# Patient Record
Sex: Female | Born: 1980 | Race: Black or African American | Hispanic: No | Marital: Married | Smoking: Never smoker
Health system: Southern US, Community
[De-identification: ages and names within clinical notes are randomized; demographics above are authoritative.]

## PROBLEM LIST (undated history)

## (undated) DIAGNOSIS — B019 Varicella without complication: Secondary | ICD-10-CM

## (undated) DIAGNOSIS — D649 Anemia, unspecified: Secondary | ICD-10-CM

## (undated) DIAGNOSIS — O99019 Anemia complicating pregnancy, unspecified trimester: Secondary | ICD-10-CM

## (undated) DIAGNOSIS — N736 Female pelvic peritoneal adhesions (postinfective): Secondary | ICD-10-CM

## (undated) DIAGNOSIS — T7840XA Allergy, unspecified, initial encounter: Secondary | ICD-10-CM

## (undated) HISTORY — DX: Anemia, unspecified: D64.9

## (undated) HISTORY — DX: Female pelvic peritoneal adhesions (postinfective): N73.6

## (undated) HISTORY — PX: DENTAL SURGERY: SHX609

## (undated) HISTORY — DX: Anemia complicating pregnancy, unspecified trimester: O99.019

## (undated) HISTORY — DX: Varicella without complication: B01.9

## (undated) HISTORY — DX: Allergy, unspecified, initial encounter: T78.40XA

---

## 2000-11-18 ENCOUNTER — Emergency Department (HOSPITAL_COMMUNITY): Admission: EM | Admit: 2000-11-18 | Discharge: 2000-11-18 | Payer: Self-pay | Admitting: Emergency Medicine

## 2000-11-26 ENCOUNTER — Emergency Department (HOSPITAL_COMMUNITY): Admission: EM | Admit: 2000-11-26 | Discharge: 2000-11-26 | Payer: Self-pay | Admitting: Emergency Medicine

## 2004-08-16 ENCOUNTER — Other Ambulatory Visit: Admission: RE | Admit: 2004-08-16 | Discharge: 2004-08-16 | Payer: Self-pay | Admitting: Obstetrics and Gynecology

## 2005-03-10 ENCOUNTER — Inpatient Hospital Stay (HOSPITAL_COMMUNITY): Admission: AD | Admit: 2005-03-10 | Discharge: 2005-03-14 | Payer: Self-pay | Admitting: *Deleted

## 2005-03-11 ENCOUNTER — Encounter (INDEPENDENT_AMBULATORY_CARE_PROVIDER_SITE_OTHER): Payer: Self-pay | Admitting: Specialist

## 2005-08-27 ENCOUNTER — Other Ambulatory Visit: Admission: RE | Admit: 2005-08-27 | Discharge: 2005-08-27 | Payer: Self-pay | Admitting: Obstetrics and Gynecology

## 2006-10-16 ENCOUNTER — Other Ambulatory Visit: Admission: RE | Admit: 2006-10-16 | Discharge: 2006-10-16 | Payer: Self-pay | Admitting: Obstetrics and Gynecology

## 2007-02-23 ENCOUNTER — Emergency Department (HOSPITAL_COMMUNITY): Admission: EM | Admit: 2007-02-23 | Discharge: 2007-02-23 | Payer: Self-pay | Admitting: Family Medicine

## 2008-03-04 ENCOUNTER — Emergency Department (HOSPITAL_COMMUNITY): Admission: EM | Admit: 2008-03-04 | Discharge: 2008-03-04 | Payer: Self-pay | Admitting: Family Medicine

## 2011-04-28 ENCOUNTER — Emergency Department (HOSPITAL_COMMUNITY): Payer: BC Managed Care – PPO

## 2011-04-28 ENCOUNTER — Emergency Department (HOSPITAL_COMMUNITY)
Admission: EM | Admit: 2011-04-28 | Discharge: 2011-04-29 | Disposition: A | Discharge status: Home / Self Care | Payer: BC Managed Care – PPO | Attending: Emergency Medicine | Admitting: Emergency Medicine

## 2011-04-28 DIAGNOSIS — S61209A Unspecified open wound of unspecified finger without damage to nail, initial encounter: Secondary | ICD-10-CM | POA: Insufficient documentation

## 2011-04-28 DIAGNOSIS — Y92009 Unspecified place in unspecified non-institutional (private) residence as the place of occurrence of the external cause: Secondary | ICD-10-CM | POA: Insufficient documentation

## 2011-04-28 DIAGNOSIS — W268XXA Contact with other sharp object(s), not elsewhere classified, initial encounter: Secondary | ICD-10-CM | POA: Insufficient documentation

## 2011-04-28 DIAGNOSIS — M79609 Pain in unspecified limb: Secondary | ICD-10-CM | POA: Insufficient documentation

## 2011-05-18 NOTE — Discharge Summary (Signed)
[name], [name]               ACCOUNT NO.:  [id number]   MEDICAL RECORD NO.:  [id number]          PATIENT TYPE:  INP   LOCATION:  9142                          FACILITY:  [facility]   PHYSICIAN:  Naima A. Dillard, M.D. DATE OF BIRTH:  [birth year]   DATE OF ADMISSION:  03/10/2005  DATE OF DISCHARGE:  03/14/2005                                 DISCHARGE SUMMARY   ADMITTING DIAGNOSIS:  Intrauterine pregnancy at term, early active labor.   PREOPERATIVE DIAGNOSIS:  Intrauterine pregnancy at term, failure to  progress, chorioamnionitis.   PROCEDURE:  Primary low transverse cesarean section.   DISCHARGE DIAGNOSES:  1.  Intrauterine pregnancy at term, early active labor.  2.  Intrauterine pregnancy at term, failure to progress, chorioamnionitis.    [name] is a 30 year old gravida 1 para 0 who presented at term in early  labor.  She progressed to 8 cm, and at that time despite Pitocin  augmentation and an appropriate amount of time the cervix was unchanged,  with the fetal head having significant molding and caput.  The decision was  made at that point to proceed with primary low transverse cesarean section  following explanation of risks and benefits by Dr. Dois Davenport Rivard.  Surgery  was performed by Dr. Silverio Lay, with Nigel Bridgeman, C.N.M. as first  assistant.  The patient delivered an 8 pound 6 ounce female infant named  Letiell, with Apgar scores of 8 at one minute, 9 at five minutes.  The  patient has progressed well in the postoperative period.  Her hemoglobin on  the first postoperative day was 8.8.  Her vital signs have remained stable,  and she has been afebrile.  Her incision is clean and intact.  It is healing  well.  She is both breast and bottle feeding, and today, her third  postoperative day, both she and her infant are judged to be in satisfactory  condition for discharge.   DISCHARGE INSTRUCTIONS:  Per [facility] handout.   DISCHARGE MEDICATIONS:  1.   Motrin 600 mg p.o. q.6 h. p.r.n. pain.  2.  Tylox 1-2 p.o. q.3-4 h. p.r.n. pain.  3.  Prenatal vitamins plus iron supplement b.i.d.   DISCHARGE FOLLOWUP:  Will be at [facility] in six weeks.      SDM/MEDQ  D:  03/14/2005  T:  03/14/2005  Job:  [id number]
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
## 2011-05-18 NOTE — Op Note (Signed)
NAMEKYNZLEY, DOWSON               ACCOUNT NO.:  [id number]   MEDICAL RECORD NO.:  [id number]          PATIENT TYPE:  INP   LOCATION:  9173                          [facility]ACILITY:  [facility]   PHYSICIAN:  Crist Fat. Rivard, M.D. DATE OF BIRTH:  [birth year]   DATE OF PROCEDURE:  03/11/2005  DATE OF DISCHARGE:                                 OPERATIVE REPORT   PREOPERATIVE DIAGNOSIS:  Intrauterine pregnancy at 39 weeks 3 days, failure  to progress, chorioamnionitis.   POSTOPERATIVE DIAGNOSIS:  Intrauterine pregnancy at 39 weeks 3 days, failure  to progress, chorioamnionitis.   PROCEDURE:  Primary low transverse cesarean section.   SURGEON:  Crist Fat. Rivard, M.D.   ASSISTANTRenaldo Reel. Emilee Hero, C.N.M.   ANESTHESIA:  Epidural by Quillian Quince, M.D.   ESTIMATED BLOOD LOSS:  700 mL.   DESCRIPTION OF PROCEDURE:  After being informed of the planned procedure  with possible complications including bleeding, infection, injury to bowel,  bladder, and ureters, informed consent is obtained.  The patient is taken to  OR#1 and pre-existing epidural is reinforced.  She is prepped and draped in  the usual sterile fashion, placed in the dorsal decubitus position, pelvis  tilted to the left, and a Foley catheter is already in her bladder.   After ascertaining adequate level of anesthesia, the suprapubic area is  infiltrated with 20 mL of Marcaine 0.25% and we performed a Pfannenstiel  incision which is brought down to the fascia sharply.  The fascia is incised  in a low transverse fashion.  Linea alba is dissected.  Peritoneum is  entered in a midline fashion.  The visceroperitoneum is entered in a low  transverse fashion allowing [place] to safely retract bladder by developing a  bladder flap.  Myometrium is then entered in a low transverse fashion first  sharply, then extended bluntly.  Amniotic fluid is clear.  We assisted the  birth of a female infant in LOA at 11:19 a.m.  Mouth and nose are suctioned.  Body of the baby is delivered.  Cord is clamped with two Kelly clamps and  sectioned and the baby is given to the pediatrician present in the room.   20 mL of blood is drawn from the umbilical vein and the placenta is allowed  to deliver spontaneously.  It is complete and cord has three vessels and  placenta is sent to pathology due to the history of chorioamnionitis.  Uterine revision is negative.   We then proceeded with closure of the myometrium in two layers first with a  running locked suture of 0 Vicryl and then with a Lembert suture of 0 Vicryl  imbricating the first one.  Hemostasis is assessed and adequate.  Both  pericolic gutters are cleansed.  Both tubes and ovaries assessed and normal.  The pelvis is then profusely irrigated with warm saline.  Hemostasis  reassessed and adequate.   On the fascia, hemostasis is completed with cautery and fascia is closed  with two running sutures of 0 Vicryl meeting midline.  Wound is irrigated  with warm saline.  Hemostasis is completed  with cautery and skin is closed  with staples.   Needle, sponge, and instrument counts correct x2.  Estimated blood loss is  700 mL.  The procedure is well tolerated by the patient who is taken to the  recovery room in well and stable condition.   A little boy named Lattrell was born at 11:19 a.m., received Apgars of 8 at  one minute and 9 at five minutes, and weighed 8 pounds 6 ounces.      SAR/MEDQ  D:  03/11/2005  T:  03/12/2005  Job:  [id number]

## 2011-12-26 ENCOUNTER — Encounter (HOSPITAL_COMMUNITY): Payer: Self-pay | Admitting: *Deleted

## 2011-12-26 ENCOUNTER — Inpatient Hospital Stay (HOSPITAL_COMMUNITY)
Admission: AD | Admit: 2011-12-26 | Discharge: 2011-12-26 | Disposition: A | Discharge status: Home / Self Care | Payer: BC Managed Care – HMO | Source: Ambulatory Visit | Attending: Obstetrics and Gynecology | Admitting: Obstetrics and Gynecology

## 2011-12-26 DIAGNOSIS — O99891 Other specified diseases and conditions complicating pregnancy: Secondary | ICD-10-CM | POA: Insufficient documentation

## 2011-12-26 LAB — WET PREP, GENITAL
Clue Cells Wet Prep HPF POC: NONE SEEN
Trich, Wet Prep: NONE SEEN

## 2011-12-26 LAB — URINALYSIS, ROUTINE W REFLEX MICROSCOPIC
Leukocytes, UA: NEGATIVE
Nitrite: NEGATIVE
Specific Gravity, Urine: 1.025 (ref 1.005–1.030)
pH: 6 (ref 5.0–8.0)

## 2011-12-26 NOTE — Progress Notes (Signed)
Reports "i keep wetting myself and i just want to make sure it isn't fluid. It is thinner like fluid from the baby." this has been happening off/on for a week. States it seems to be getting worse. G2 P1.

## 2011-12-26 NOTE — Progress Notes (Signed)
Notified of pt presenting for ? Leaking.  Will be down to see pt.

## 2011-12-26 NOTE — Discharge Instructions (Signed)
You have leukorrhea, which is white blood cells near the cervix. This is normal and you may notice increased mucous or watery discharge.  You need to be on pelvic rest until further notice. No sexual intercourse. If you have increased discharge, that is a large amount of water or has an odor or itching, or heavy vaginal bleeding, please call the office.  If you have increased pelvic cramping, please call the office. Please schedule an appointment in 1 week.

## 2011-12-26 NOTE — ED Provider Notes (Signed)
History     Chief Complaint  Patient presents with  . Rupture of Membranes   HPI Comments: Pt is a G3P1 at [redacted]w[redacted]d with CC of leaking fluid. Pt called the office today and was told to come in. She states this has been going on x1 week, feels like she also has urinary urgency. Denies odor to the d/c, no itching or burning. She denies any VB or ctx/cramping. Has not really had FM yet.  She was evaluated in the 1st trimester for VB and noted to have a [facility] that did resolve.       Past Medical History  Diagnosis Date  . No pertinent past medical history     Past Surgical History  Procedure Date  . Cesarean section   . Dental surgery     History reviewed. No pertinent family history.  History  Substance Use Topics  . Smoking status: Never Smoker   . Smokeless tobacco: Not on file  . Alcohol Use: No    Allergies: No Known Allergies  Prescriptions prior to admission  Medication Sig Dispense Refill  . Prenatal Vit-Fe Fumarate-FA (PRENATAL MULTIVITAMIN) TABS Take 1 tablet by mouth daily.          Review of Systems  Genitourinary: Positive for urgency.       Vaginal d/c leaking?  All other systems reviewed and are negative.   Physical Exam   Blood pressure 133/83, pulse 86, temperature 99 F (37.2 C), resp. rate 16, height 5\' 3"  (1.6 m), weight 65.318 kg (144 lb), SpO2 99.00%.  Physical Exam  Nursing note and vitals reviewed. Constitutional: She is oriented to person, place, and time. She appears well-developed and well-nourished.  Neck: Normal range of motion.  Cardiovascular: Normal rate.   Respiratory: Effort normal.  GI: Soft. She exhibits no distension. There is no tenderness.  Genitourinary: Vaginal discharge found.       Leukorrhea cx - external os FT, int os closed, long, high soft  Musculoskeletal: Normal range of motion. She exhibits no edema.  Neurological: She is alert and oriented to person, place, and time. She has normal reflexes.  Skin: Skin is warm  and dry.  Psychiatric: She has a normal mood and affect. Her behavior is normal.   FHT's 160's Results for orders placed during the hospital encounter of 12/26/11 (from the past 24 hour(s))  URINALYSIS, ROUTINE W REFLEX MICROSCOPIC     Status: Normal   Collection Time   12/26/11  8:01 PM      Component Value Range   Color, Urine YELLOW  YELLOW    APPearance CLEAR  CLEAR    Specific Gravity, Urine 1.025  1.005 - 1.030    pH 6.0  5.0 - 8.0    Glucose, UA NEGATIVE  NEGATIVE (mg/dL)   Hgb urine dipstick NEGATIVE  NEGATIVE    Bilirubin Urine NEGATIVE  NEGATIVE    Ketones, ur NEGATIVE  NEGATIVE (mg/dL)   Protein, ur NEGATIVE  NEGATIVE (mg/dL)   Urobilinogen, UA 0.2  0.0 - 1.0 (mg/dL)   Nitrite NEGATIVE  NEGATIVE    Leukocytes, UA NEGATIVE  NEGATIVE   WET PREP, GENITAL     Status: Abnormal   Collection Time   12/26/11  8:12 PM      Component Value Range   Yeast, Wet Prep NONE SEEN  NONE SEEN    Trich, Wet Prep NONE SEEN  NONE SEEN    Clue Cells, Wet Prep NONE SEEN  NONE SEEN  WBC, Wet Prep HPF POC FEW (*) NONE SEEN      MAU Course  Procedures  Wet prep [place] PE  Assessment and Plan  IUP 15w UA and wet prep normal except for [facility]  External os FT Pt to f/u office 1 week   Alano Blasco M 12/26/2011, 8:54 PM

## 2011-12-26 NOTE — Progress Notes (Signed)
S. Lillard, CNM at bedside.  Assessment done and poc discussed with pt.  

## 2011-12-26 NOTE — Progress Notes (Signed)
SSE per CNM.  Wet prep and cultures collected.  VE done.  

## 2012-03-21 ENCOUNTER — Other Ambulatory Visit (INDEPENDENT_AMBULATORY_CARE_PROVIDER_SITE_OTHER): Payer: BC Managed Care – PPO

## 2012-03-21 ENCOUNTER — Encounter (INDEPENDENT_AMBULATORY_CARE_PROVIDER_SITE_OTHER): Payer: BC Managed Care – PPO

## 2012-03-21 DIAGNOSIS — Z331 Pregnant state, incidental: Secondary | ICD-10-CM

## 2012-04-01 ENCOUNTER — Encounter (INDEPENDENT_AMBULATORY_CARE_PROVIDER_SITE_OTHER): Payer: BC Managed Care – PPO | Admitting: Registered Nurse

## 2012-04-01 DIAGNOSIS — Z331 Pregnant state, incidental: Secondary | ICD-10-CM

## 2012-04-16 ENCOUNTER — Encounter: Payer: Self-pay | Admitting: Obstetrics and Gynecology

## 2012-04-16 ENCOUNTER — Ambulatory Visit (INDEPENDENT_AMBULATORY_CARE_PROVIDER_SITE_OTHER): Payer: BC Managed Care – PPO | Admitting: Obstetrics and Gynecology

## 2012-04-16 VITALS — BP 100/70 | Wt 166.0 lb

## 2012-04-16 DIAGNOSIS — Z3480 Encounter for supervision of other normal pregnancy, unspecified trimester: Secondary | ICD-10-CM

## 2012-04-16 DIAGNOSIS — Z98891 History of uterine scar from previous surgery: Secondary | ICD-10-CM

## 2012-04-16 MED ORDER — MICONAZOLE NITRATE 2 % EX CREA: TOPICAL_CREAM | Freq: Two times a day (BID) | CUTANEOUS | Status: DC

## 2012-04-16 NOTE — Progress Notes (Signed)
Doing well,  Rv'd FKC and PTL sx's Has rash on L forearm, small about 1cm, ? 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

## 2012-05-01 ENCOUNTER — Ambulatory Visit (INDEPENDENT_AMBULATORY_CARE_PROVIDER_SITE_OTHER): Payer: BC Managed Care – PPO | Admitting: Obstetrics and Gynecology

## 2012-05-01 ENCOUNTER — Encounter: Payer: Self-pay | Admitting: Obstetrics and Gynecology

## 2012-05-01 VITALS — BP 110/62 | Wt 166.0 lb

## 2012-05-01 DIAGNOSIS — D649 Anemia, unspecified: Secondary | ICD-10-CM | POA: Insufficient documentation

## 2012-05-01 DIAGNOSIS — Z98891 History of uterine scar from previous surgery: Secondary | ICD-10-CM

## 2012-05-01 DIAGNOSIS — Z9889 Other specified postprocedural states: Secondary | ICD-10-CM

## 2012-05-01 DIAGNOSIS — Z331 Pregnant state, incidental: Secondary | ICD-10-CM | POA: Insufficient documentation

## 2012-05-01 NOTE — Progress Notes (Signed)
Patient ID: [name], female   DOB: [birth year], 31 y.o.   MRN: [id number] C/o pain with very active baby last night low in front no pain today Vag LTC Reviewed s/s preterm labor, srom, vag bleeding, kick counts to report, enc 8 water daily and frequent voids H/H today Lavera Guise,  CNM

## 2012-05-01 NOTE — Progress Notes (Signed)
Pt states that baby was very active last night also states that she was feeling a pinching pelvic area, pt request vaginal exam.

## 2012-05-02 LAB — HEMOGLOBIN AND HEMATOCRIT, BLOOD: Hemoglobin: 9.5 g/dL — ABNORMAL LOW (ref 12.0–15.0)

## 2012-05-08 ENCOUNTER — Telehealth: Payer: Self-pay

## 2012-05-08 NOTE — Telephone Encounter (Signed)
Message copied by Janeece Agee on Thu May 08, 2012  3:48 PM ------      Message from: [facility]      Created: Tue May 06, 2012  9:31 AM      Regarding: anemia call taking pnv and iron bid recommended, colace 100 mg bid or miralax 1-5 daily OTC store brand                   ----- Message -----         From: [facility]         Sent: 05/02/2012  12:21 AM           To: Lavera Guise, CNM

## 2012-05-09 ENCOUNTER — Telehealth: Payer: Self-pay | Admitting: Obstetrics and Gynecology

## 2012-05-09 NOTE — Telephone Encounter (Signed)
Spoke with pt informing her HCT/Hemoglobin abnormal & Corrie Dandy recommends taking pnv and iron bid, & colace 100 mg bid or miralax 1-5 daily OTC store brand. Pt agrees and voices understanding.

## 2012-05-12 ENCOUNTER — Ambulatory Visit (INDEPENDENT_AMBULATORY_CARE_PROVIDER_SITE_OTHER): Payer: BC Managed Care – PPO | Admitting: Obstetrics and Gynecology

## 2012-05-12 ENCOUNTER — Encounter: Payer: Self-pay | Admitting: Obstetrics and Gynecology

## 2012-05-12 VITALS — BP 114/62 | Wt 165.0 lb

## 2012-05-12 DIAGNOSIS — Z331 Pregnant state, incidental: Secondary | ICD-10-CM

## 2012-05-12 NOTE — Progress Notes (Signed)
Pt states discharge has gotten thicker

## 2012-05-12 NOTE — Progress Notes (Signed)
Patient ID: [name], female   DOB: 1981-07-22, 31 y.o.   MRN: [id number] Reviewed s/s preterm labor, srom, vag bleeding, kick counts to report, enc 8 water daily and frequent voids Lavera Guise, CNM

## 2012-05-20 ENCOUNTER — Ambulatory Visit (INDEPENDENT_AMBULATORY_CARE_PROVIDER_SITE_OTHER): Payer: BC Managed Care – PPO | Admitting: Obstetrics and Gynecology

## 2012-05-20 VITALS — BP 110/62 | Wt 170.0 lb

## 2012-05-20 DIAGNOSIS — Z331 Pregnant state, incidental: Secondary | ICD-10-CM

## 2012-05-20 NOTE — Progress Notes (Signed)
GBS today. Pt states she has no concerns today.  Pt states she is taking iron "Floradix ". Pt asked will she be getting any more blood work done to check her iron levels.

## 2012-05-20 NOTE — Progress Notes (Signed)
Beta strep sent.  Return to office in one week.  VBAC discussed.

## 2012-05-29 ENCOUNTER — Ambulatory Visit (INDEPENDENT_AMBULATORY_CARE_PROVIDER_SITE_OTHER): Payer: BC Managed Care – PPO | Admitting: Obstetrics and Gynecology

## 2012-05-29 ENCOUNTER — Encounter: Payer: Self-pay | Admitting: Obstetrics and Gynecology

## 2012-05-29 VITALS — BP 100/56 | Wt 172.0 lb

## 2012-05-29 DIAGNOSIS — Z98891 History of uterine scar from previous surgery: Secondary | ICD-10-CM

## 2012-05-29 DIAGNOSIS — Z9889 Other specified postprocedural states: Secondary | ICD-10-CM

## 2012-05-29 NOTE — Progress Notes (Signed)
A/P GBS negative Fetal kick counts reviewed Labor reviewed with pt All patients  questions answered 

## 2012-05-29 NOTE — Progress Notes (Signed)
Pt states she has no concerns today.  

## 2012-06-06 ENCOUNTER — Ambulatory Visit (INDEPENDENT_AMBULATORY_CARE_PROVIDER_SITE_OTHER): Payer: BC Managed Care – PPO | Admitting: Obstetrics and Gynecology

## 2012-06-06 VITALS — BP 104/60 | Wt 172.0 lb

## 2012-06-06 DIAGNOSIS — Z331 Pregnant state, incidental: Secondary | ICD-10-CM

## 2012-06-06 NOTE — Progress Notes (Signed)
Doing well.  Return office in one week.

## 2012-06-06 NOTE — Progress Notes (Signed)
Pt states she has no concerns. Pt desires cervix check. today 

## 2012-06-11 ENCOUNTER — Ambulatory Visit (INDEPENDENT_AMBULATORY_CARE_PROVIDER_SITE_OTHER): Payer: BC Managed Care – PPO | Admitting: Obstetrics and Gynecology

## 2012-06-11 ENCOUNTER — Encounter: Payer: Self-pay | Admitting: Obstetrics and Gynecology

## 2012-06-11 VITALS — BP 99/58 | Wt 170.0 lb

## 2012-06-11 DIAGNOSIS — Z331 Pregnant state, incidental: Secondary | ICD-10-CM

## 2012-06-11 DIAGNOSIS — O480 Post-term pregnancy: Secondary | ICD-10-CM

## 2012-06-11 NOTE — Progress Notes (Signed)
Ready! Pt noticed yellow d'c this am on pad denies itching & odor Wants cx check today

## 2012-06-11 NOTE — Progress Notes (Signed)
Very ready!  Cervix too posterior to sweep membranes today. Labor s/s reviewed.  Still wants VBAC--discussed recommendation to avoid induction if possible until 41+ weeks. Patient agreeable with plan.  I will get induction scheduled to have something on the books for 41 + weeks.  Note sent to [facility] to schedule at 41 1/7- 41 3/7 wks. NST NV.  No evidence SROM--just increased thin d/c.  No pooling and nitrazene negative.

## 2012-06-17 ENCOUNTER — Inpatient Hospital Stay (HOSPITAL_COMMUNITY)
Admission: AD | Admit: 2012-06-17 | Discharge: 2012-06-21 | DRG: 370 | Disposition: A | Discharge status: Home / Self Care | Payer: BC Managed Care – PPO | Source: Ambulatory Visit | Attending: Obstetrics and Gynecology | Admitting: Obstetrics and Gynecology

## 2012-06-17 ENCOUNTER — Encounter (HOSPITAL_COMMUNITY): Payer: Self-pay | Admitting: *Deleted

## 2012-06-17 ENCOUNTER — Telehealth: Payer: Self-pay | Admitting: Obstetrics and Gynecology

## 2012-06-17 ENCOUNTER — Encounter (HOSPITAL_COMMUNITY): Payer: Self-pay | Admitting: Anesthesiology

## 2012-06-17 DIAGNOSIS — O34219 Maternal care for unspecified type scar from previous cesarean delivery: Secondary | ICD-10-CM | POA: Diagnosis present

## 2012-06-17 DIAGNOSIS — Z98891 History of uterine scar from previous surgery: Secondary | ICD-10-CM

## 2012-06-17 DIAGNOSIS — O9903 Anemia complicating the puerperium: Secondary | ICD-10-CM | POA: Diagnosis not present

## 2012-06-17 DIAGNOSIS — D649 Anemia, unspecified: Secondary | ICD-10-CM | POA: Diagnosis not present

## 2012-06-17 DIAGNOSIS — O324XX0 Maternal care for high head at term, not applicable or unspecified: Secondary | ICD-10-CM | POA: Diagnosis present

## 2012-06-17 LAB — CBC
MCHC: 33.4 g/dL (ref 30.0–36.0)
Platelets: 142 10*3/uL — ABNORMAL LOW (ref 150–400)
RDW: 13.9 % (ref 11.5–15.5)
WBC: 10 10*3/uL (ref 4.0–10.5)

## 2012-06-17 LAB — OB RESULTS CONSOLE RUBELLA ANTIBODY, IGM: Rubella: IMMUNE

## 2012-06-17 LAB — OB RESULTS CONSOLE HEPATITIS B SURFACE ANTIGEN: Hepatitis B Surface Ag: NEGATIVE

## 2012-06-17 LAB — OB RESULTS CONSOLE ABO/RH: RH Type: POSITIVE

## 2012-06-17 LAB — OB RESULTS CONSOLE ANTIBODY SCREEN: Antibody Screen: NEGATIVE

## 2012-06-17 MED ORDER — EPHEDRINE 5 MG/ML INJ
10.0000 mg | INTRAVENOUS | Status: DC | PRN
  Filled 2012-06-17: qty 4

## 2012-06-17 MED ORDER — LACTATED RINGERS IV SOLN: 500.0000 mL | Freq: Once | INTRAVENOUS | Status: DC

## 2012-06-17 MED ORDER — OXYTOCIN BOLUS FROM INFUSION
250.0000 mL | Freq: Once | INTRAVENOUS | Status: DC
  Filled 2012-06-17: qty 500

## 2012-06-17 MED ORDER — FENTANYL 2.5 MCG/ML BUPIVACAINE 1/10 % EPIDURAL INFUSION (WH - ANES)
14.0000 mL/h | INTRAMUSCULAR | Status: DC
  Administered 2012-06-18: 14 mL/h via EPIDURAL
  Filled 2012-06-17 (×2): qty 60

## 2012-06-17 MED ORDER — OXYTOCIN 40 UNITS IN LACTATED RINGERS INFUSION - SIMPLE MED
62.5000 mL/h | Freq: Once | INTRAVENOUS | Status: DC
  Filled 2012-06-17: qty 1000

## 2012-06-17 MED ORDER — FENTANYL CITRATE 0.05 MG/ML IJ SOLN
100.0000 ug | INTRAMUSCULAR | Status: DC | PRN
  Administered 2012-06-18: 50 ug via INTRAVENOUS
  Filled 2012-06-17: qty 2

## 2012-06-17 MED ORDER — PHENYLEPHRINE 40 MCG/ML (10ML) SYRINGE FOR IV PUSH (FOR BLOOD PRESSURE SUPPORT): 80.0000 ug | PREFILLED_SYRINGE | INTRAVENOUS | Status: DC | PRN

## 2012-06-17 MED ORDER — EPHEDRINE 5 MG/ML INJ: 10.0000 mg | INTRAVENOUS | Status: DC | PRN

## 2012-06-17 MED ORDER — CITRIC ACID-SODIUM CITRATE 334-500 MG/5ML PO SOLN
30.0000 mL | ORAL | Status: DC | PRN
  Administered 2012-06-18: 30 mL via ORAL
  Filled 2012-06-17: qty 15

## 2012-06-17 MED ORDER — LACTATED RINGERS IV SOLN: 500.0000 mL | INTRAVENOUS | Status: DC | PRN

## 2012-06-17 MED ORDER — IBUPROFEN 600 MG PO TABS: 600.0000 mg | ORAL_TABLET | Freq: Four times a day (QID) | ORAL | Status: DC | PRN

## 2012-06-17 MED ORDER — DIPHENHYDRAMINE HCL 50 MG/ML IJ SOLN: 12.5000 mg | INTRAMUSCULAR | Status: DC | PRN

## 2012-06-17 MED ORDER — OXYCODONE-ACETAMINOPHEN 5-325 MG PO TABS: 1.0000 | ORAL_TABLET | ORAL | Status: DC | PRN

## 2012-06-17 MED ORDER — LIDOCAINE HCL (PF) 1 % IJ SOLN
30.0000 mL | INTRAMUSCULAR | Status: DC | PRN
  Filled 2012-06-17: qty 30

## 2012-06-17 MED ORDER — ACETAMINOPHEN 325 MG PO TABS: 650.0000 mg | ORAL_TABLET | ORAL | Status: DC | PRN

## 2012-06-17 MED ORDER — OXYTOCIN 10 UNIT/ML IJ SOLN: 10.0000 [IU] | Freq: Once | INTRAMUSCULAR | Status: DC

## 2012-06-17 MED ORDER — PHENYLEPHRINE 40 MCG/ML (10ML) SYRINGE FOR IV PUSH (FOR BLOOD PRESSURE SUPPORT)
80.0000 ug | PREFILLED_SYRINGE | INTRAVENOUS | Status: DC | PRN
  Filled 2012-06-17: qty 5

## 2012-06-17 MED ORDER — LACTATED RINGERS IV SOLN
INTRAVENOUS | Status: DC
  Administered 2012-06-17 – 2012-06-18 (×2): via INTRAVENOUS

## 2012-06-17 MED ORDER — ONDANSETRON HCL 4 MG/2ML IJ SOLN: 4.0000 mg | Freq: Four times a day (QID) | INTRAMUSCULAR | Status: DC | PRN

## 2012-06-17 NOTE — Anesthesia Preprocedure Evaluation (Addendum)

## 2012-06-17 NOTE — H&P (Signed)
[name] is a 31 y.o. female presenting for reg strong ctx that have been getting closer and stronger since about 730pm, she reports some bloody show, no LOF, +FM. She declines any pain meds at present.  Desires TOLAC  Anemia GBS neg  Maternal Medical History:  Reason for admission: Reason for admission: contractions.  Contractions: Onset was 3-5 hours ago.   Frequency: regular.   Duration is approximately 60 seconds.   Perceived severity is strong.    Fetal activity: Perceived fetal activity is normal.   Last perceived fetal movement was within the past hour.    Prenatal complications: no prenatal complications   OB History    Grav Para Term Preterm Abortions TAB SAB Ect Mult Living   3 1 1  0 1 0 1 0 0 1     Past Medical History  Diagnosis Date  . No pertinent past medical history   . Normal pregnancy, incidental 05/01/2012  . Chicken pox    Past Surgical History  Procedure Date  . Cesarean section   . Dental surgery    Family History: family history is not on file. Social History:  reports that she has never smoked. She has never used smokeless tobacco. She reports that she does not drink alcohol or use illicit drugs.   Prenatal Transfer Tool  Maternal Diabetes: No Genetic Screening: Normal Maternal Ultrasounds/Referrals: Normal Fetal Ultrasounds or other Referrals:  None Maternal Substance Abuse:  No Significant Maternal Medications:  None Significant Maternal Lab Results:  None Other Comments:  None  Review of Systems  All other systems reviewed and are negative.    Dilation: 7 Effacement (%): 100 Station: -1 Exam by:: s. [name], cnm Blood pressure 137/70, pulse 87, temperature 99.3 F (37.4 C), temperature source Oral, resp. rate 20, height 5\' 3"  (1.6 m), weight 174 lb (78.926 kg), last menstrual period 09/06/2011, SpO2 100.00%. Maternal Exam:  Uterine Assessment: Contraction strength is firm.  Contraction duration is 60 seconds. Contraction  frequency is regular.   Abdomen: Patient reports no abdominal tenderness. Fundal height is aga.   Estimated fetal weight is 8#.   Fetal presentation: vertex  Introitus: Normal vulva. Normal vagina.  Pelvis: adequate for delivery.   Cervix: Cervix evaluated by digital exam.     Fetal Exam Fetal Monitor Review: Mode: ultrasound.   Baseline rate: 140.  Variability: moderate (6-25 bpm).   Pattern: no decelerations and no accelerations.    Fetal State Assessment: Category II - tracings are indeterminate.     Physical Exam  Nursing note and vitals reviewed. Constitutional: She is oriented to person, place, and time. She appears well-developed and well-nourished. She appears distressed.       Pt grimacing and breathing w ctx, shakey  HENT:  Head: Normocephalic and atraumatic.  Neck: Normal range of motion.  Cardiovascular: Normal rate, regular rhythm and normal heart sounds.   Respiratory: Effort normal and breath sounds normal.  GI: Soft. Bowel sounds are normal.       Gravid,   Genitourinary: Vagina normal.       7cm/100/-1 BBOW, sm amt bloody show  Musculoskeletal: Normal range of motion. She exhibits no edema.  Neurological: She is alert and oriented to person, place, and time.  Skin: Skin is warm and dry.  Psychiatric: She has a normal mood and affect. Her behavior is normal.    Prenatal labs: ABO, Rh: A/Positive/-- (06/18 0000) Antibody: Negative (06/18 0000) Rubella: Immune (06/18 0000) RPR: Nonreactive (06/18 0000)  HBsAg: Negative (  06/18 0000)  HIV: Non-reactive (06/18 0000)  GBS: NEGATIVE (05/21 1724)   Assessment/Plan: IUP at [redacted]w[redacted]d GBS neg Desires TOLAC Active labor FHR reassuring but not-reactive,   Admit to b.s. Dr Su Hilt attending Routine L&D orders Analgesia/anesthesia PRN    Rockie Schnoor M 06/17/2012, 9:54 PM

## 2012-06-17 NOTE — MAU Note (Signed)
Pt reports contractions q 4-5 minutes x 2 hours, brownish discharge

## 2012-06-17 NOTE — MAU Note (Signed)
C/O contractions since 1930

## 2012-06-17 NOTE — Telephone Encounter (Signed)
Spoke with pt rgd concerns pt states 40 weeks with contractions every 10 mins since 4 am positive fetal movement no spotting no bleeding no leaking fluid advised pt rest increase water intake monitor contraction when every 5 mins for an hour call office pt voice understanding

## 2012-06-18 ENCOUNTER — Encounter (HOSPITAL_COMMUNITY): Payer: Self-pay | Admitting: Anesthesiology

## 2012-06-18 ENCOUNTER — Inpatient Hospital Stay (HOSPITAL_COMMUNITY): Payer: BC Managed Care – PPO | Admitting: Anesthesiology

## 2012-06-18 ENCOUNTER — Encounter (HOSPITAL_COMMUNITY)
Admission: AD | Disposition: A | Discharge status: Home / Self Care | Payer: Self-pay | Source: Ambulatory Visit | Attending: Obstetrics and Gynecology

## 2012-06-18 ENCOUNTER — Encounter (HOSPITAL_COMMUNITY): Payer: Self-pay | Admitting: *Deleted

## 2012-06-18 DIAGNOSIS — O324XX0 Maternal care for high head at term, not applicable or unspecified: Secondary | ICD-10-CM

## 2012-06-18 LAB — TYPE AND SCREEN
ABO/RH(D): A POS
Antibody Screen: NEGATIVE

## 2012-06-18 SURGERY — Surgical Case
Anesthesia: Regional | Site: Abdomen

## 2012-06-18 MED ORDER — CEFAZOLIN SODIUM-DEXTROSE 2-3 GM-% IV SOLR
2.0000 g | Freq: Once | INTRAVENOUS | Status: AC
  Administered 2012-06-18: 2 g via INTRAVENOUS
  Filled 2012-06-18: qty 50

## 2012-06-18 MED ORDER — SCOPOLAMINE 1 MG/3DAYS TD PT72
1.0000 | MEDICATED_PATCH | Freq: Once | TRANSDERMAL | Status: AC
  Administered 2012-06-18: 1.5 mg via TRANSDERMAL

## 2012-06-18 MED ORDER — SIMETHICONE 80 MG PO CHEW: 80.0000 mg | CHEWABLE_TABLET | ORAL | Status: DC | PRN

## 2012-06-18 MED ORDER — ONDANSETRON HCL 4 MG/2ML IJ SOLN
INTRAMUSCULAR | Status: AC
  Filled 2012-06-18: qty 2

## 2012-06-18 MED ORDER — MORPHINE SULFATE (PF) 0.5 MG/ML IJ SOLN
INTRAMUSCULAR | Status: DC | PRN
  Administered 2012-06-18: 4 mg via EPIDURAL
  Administered 2012-06-18: 1 mg via INTRAVENOUS

## 2012-06-18 MED ORDER — PHENYLEPHRINE 40 MCG/ML (10ML) SYRINGE FOR IV PUSH (FOR BLOOD PRESSURE SUPPORT)
PREFILLED_SYRINGE | INTRAVENOUS | Status: AC
  Filled 2012-06-18: qty 10

## 2012-06-18 MED ORDER — SODIUM BICARBONATE 8.4 % IV SOLN
INTRAVENOUS | Status: AC
  Filled 2012-06-18: qty 50

## 2012-06-18 MED ORDER — FENTANYL 2.5 MCG/ML BUPIVACAINE 1/10 % EPIDURAL INFUSION (WH - ANES)
INTRAMUSCULAR | Status: DC | PRN
  Administered 2012-06-18: 14 mL/h via EPIDURAL

## 2012-06-18 MED ORDER — LIDOCAINE-EPINEPHRINE (PF) 2 %-1:200000 IJ SOLN
INTRAMUSCULAR | Status: AC
  Filled 2012-06-18: qty 20

## 2012-06-18 MED ORDER — TERBUTALINE SULFATE 1 MG/ML IJ SOLN
INTRAMUSCULAR | Status: AC
  Administered 2012-06-18: 0.25 mg
  Filled 2012-06-18: qty 1

## 2012-06-18 MED ORDER — MORPHINE SULFATE 0.5 MG/ML IJ SOLN
INTRAMUSCULAR | Status: AC
  Filled 2012-06-18: qty 10

## 2012-06-18 MED ORDER — LACTATED RINGERS IV SOLN
INTRAVENOUS | Status: DC
  Administered 2012-06-18 – 2012-06-19 (×3): via INTRAVENOUS

## 2012-06-18 MED ORDER — DOCUSATE SODIUM 50 MG/5ML PO LIQD
100.0000 mg | Freq: Two times a day (BID) | ORAL | Status: DC
  Administered 2012-06-18 – 2012-06-21 (×5): 100 mg via ORAL
  Filled 2012-06-18 (×6): qty 10

## 2012-06-18 MED ORDER — OXYTOCIN 10 UNIT/ML IJ SOLN
INTRAMUSCULAR | Status: AC
  Filled 2012-06-18: qty 4

## 2012-06-18 MED ORDER — FENTANYL CITRATE 0.05 MG/ML IJ SOLN
INTRAMUSCULAR | Status: DC | PRN
Start: 2012-06-18 — End: 2012-06-18
  Administered 2012-06-18: 100 ug via INTRAVENOUS

## 2012-06-18 MED ORDER — MEPERIDINE HCL 25 MG/ML IJ SOLN
INTRAMUSCULAR | Status: DC | PRN
  Administered 2012-06-18: 25 mg via INTRAVENOUS

## 2012-06-18 MED ORDER — DIPHENHYDRAMINE HCL 50 MG/ML IJ SOLN: 25.0000 mg | INTRAMUSCULAR | Status: DC | PRN

## 2012-06-18 MED ORDER — MENTHOL 3 MG MT LOZG: 1.0000 | LOZENGE | OROMUCOSAL | Status: DC | PRN

## 2012-06-18 MED ORDER — DIPHENHYDRAMINE HCL 50 MG/ML IJ SOLN: 12.5000 mg | INTRAMUSCULAR | Status: DC | PRN

## 2012-06-18 MED ORDER — BUPIVACAINE-EPINEPHRINE (PF) 0.5% -1:200000 IJ SOLN
INTRAMUSCULAR | Status: AC
  Filled 2012-06-18: qty 10

## 2012-06-18 MED ORDER — IBUPROFEN 100 MG/5ML PO SUSP
600.0000 mg | Freq: Four times a day (QID) | ORAL | Status: DC
  Administered 2012-06-18 – 2012-06-21 (×10): 600 mg via ORAL
  Filled 2012-06-18 (×11): qty 30

## 2012-06-18 MED ORDER — DIPHENHYDRAMINE HCL 25 MG PO CAPS: 25.0000 mg | ORAL_CAPSULE | Freq: Four times a day (QID) | ORAL | Status: DC | PRN

## 2012-06-18 MED ORDER — SODIUM CHLORIDE 0.9 % IV SOLN
1.0000 ug/kg/h | INTRAVENOUS | Status: DC | PRN
  Filled 2012-06-18: qty 2.5

## 2012-06-18 MED ORDER — IBUPROFEN 600 MG PO TABS: 600.0000 mg | ORAL_TABLET | Freq: Four times a day (QID) | ORAL | Status: DC

## 2012-06-18 MED ORDER — FENTANYL CITRATE 0.05 MG/ML IJ SOLN
INTRAMUSCULAR | Status: AC
  Administered 2012-06-18: 50 ug via INTRAVENOUS
  Filled 2012-06-18: qty 2

## 2012-06-18 MED ORDER — MEDROXYPROGESTERONE ACETATE 150 MG/ML IM SUSP
150.0000 mg | INTRAMUSCULAR | Status: AC | PRN
  Administered 2012-06-21: 150 mg via INTRAMUSCULAR
  Filled 2012-06-18: qty 1

## 2012-06-18 MED ORDER — OXYCODONE-ACETAMINOPHEN 5-325 MG PO TABS: 1.0000 | ORAL_TABLET | ORAL | Status: DC | PRN

## 2012-06-18 MED ORDER — FENTANYL CITRATE 0.05 MG/ML IJ SOLN
INTRAMUSCULAR | Status: AC
  Filled 2012-06-18: qty 2

## 2012-06-18 MED ORDER — LANOLIN HYDROUS EX OINT: 1.0000 "application " | TOPICAL_OINTMENT | CUTANEOUS | Status: DC | PRN

## 2012-06-18 MED ORDER — ONDANSETRON HCL 4 MG/2ML IJ SOLN
4.0000 mg | INTRAMUSCULAR | Status: DC | PRN
  Administered 2012-06-18: 4 mg via INTRAVENOUS

## 2012-06-18 MED ORDER — FENTANYL CITRATE 0.05 MG/ML IJ SOLN: 25.0000 ug | INTRAMUSCULAR | Status: DC | PRN

## 2012-06-18 MED ORDER — WITCH HAZEL-GLYCERIN EX PADS: 1.0000 "application " | MEDICATED_PAD | CUTANEOUS | Status: DC | PRN

## 2012-06-18 MED ORDER — SODIUM CHLORIDE 0.9 % IJ SOLN: 3.0000 mL | INTRAMUSCULAR | Status: DC | PRN

## 2012-06-18 MED ORDER — MEASLES, MUMPS & RUBELLA VAC ~~LOC~~ INJ
0.5000 mL | INJECTION | Freq: Once | SUBCUTANEOUS | Status: DC
  Filled 2012-06-18: qty 0.5

## 2012-06-18 MED ORDER — NALBUPHINE HCL 10 MG/ML IJ SOLN
5.0000 mg | INTRAMUSCULAR | Status: DC | PRN
  Filled 2012-06-18: qty 1

## 2012-06-18 MED ORDER — PRENATAL MULTIVITAMIN CH: 1.0000 | ORAL_TABLET | Freq: Every day | ORAL | Status: DC

## 2012-06-18 MED ORDER — KETOROLAC TROMETHAMINE 30 MG/ML IJ SOLN
INTRAMUSCULAR | Status: AC
  Administered 2012-06-18: 30 mg via INTRAVENOUS
  Filled 2012-06-18: qty 1

## 2012-06-18 MED ORDER — ZOLPIDEM TARTRATE 5 MG PO TABS: 5.0000 mg | ORAL_TABLET | Freq: Every evening | ORAL | Status: DC | PRN

## 2012-06-18 MED ORDER — CEFAZOLIN SODIUM 1-5 GM-% IV SOLN
INTRAVENOUS | Status: AC
  Filled 2012-06-18: qty 100

## 2012-06-18 MED ORDER — DIBUCAINE 1 % RE OINT: 1.0000 "application " | TOPICAL_OINTMENT | RECTAL | Status: DC | PRN

## 2012-06-18 MED ORDER — METOCLOPRAMIDE HCL 5 MG/ML IJ SOLN
INTRAMUSCULAR | Status: AC
  Administered 2012-06-18: 10 mg via INTRAVENOUS
  Filled 2012-06-18: qty 2

## 2012-06-18 MED ORDER — DIPHENHYDRAMINE HCL 25 MG PO CAPS: 25.0000 mg | ORAL_CAPSULE | ORAL | Status: DC | PRN

## 2012-06-18 MED ORDER — METOCLOPRAMIDE HCL 5 MG/ML IJ SOLN
10.0000 mg | Freq: Three times a day (TID) | INTRAMUSCULAR | Status: DC | PRN
  Administered 2012-06-18: 10 mg via INTRAVENOUS

## 2012-06-18 MED ORDER — TETANUS-DIPHTH-ACELL PERTUSSIS 5-2.5-18.5 LF-MCG/0.5 IM SUSP
0.5000 mL | Freq: Once | INTRAMUSCULAR | Status: AC
  Administered 2012-06-20: 0.5 mL via INTRAMUSCULAR

## 2012-06-18 MED ORDER — NALOXONE HCL 0.4 MG/ML IJ SOLN: 0.4000 mg | INTRAMUSCULAR | Status: DC | PRN

## 2012-06-18 MED ORDER — LACTATED RINGERS IV SOLN
INTRAVENOUS | Status: DC | PRN
  Administered 2012-06-18: 08:00:00 via INTRAVENOUS

## 2012-06-18 MED ORDER — ONDANSETRON HCL 4 MG PO TABS: 4.0000 mg | ORAL_TABLET | ORAL | Status: DC | PRN

## 2012-06-18 MED ORDER — SCOPOLAMINE 1 MG/3DAYS TD PT72
MEDICATED_PATCH | TRANSDERMAL | Status: AC
  Administered 2012-06-18: 1.5 mg via TRANSDERMAL
  Filled 2012-06-18: qty 1

## 2012-06-18 MED ORDER — PHENYLEPHRINE HCL 10 MG/ML IJ SOLN
INTRAMUSCULAR | Status: DC | PRN
  Administered 2012-06-18 (×3): 80 ug via INTRAVENOUS
  Administered 2012-06-18: 40 ug via INTRAVENOUS
  Administered 2012-06-18: 80 ug via INTRAVENOUS
  Administered 2012-06-18: 40 ug via INTRAVENOUS

## 2012-06-18 MED ORDER — OXYTOCIN 40 UNITS IN LACTATED RINGERS INFUSION - SIMPLE MED: 62.5000 mL/h | INTRAVENOUS | Status: AC

## 2012-06-18 MED ORDER — SIMETHICONE 80 MG PO CHEW
80.0000 mg | CHEWABLE_TABLET | Freq: Three times a day (TID) | ORAL | Status: DC
  Administered 2012-06-18 – 2012-06-21 (×8): 80 mg via ORAL

## 2012-06-18 MED ORDER — KETOROLAC TROMETHAMINE 30 MG/ML IJ SOLN: 30.0000 mg | Freq: Four times a day (QID) | INTRAMUSCULAR | Status: DC | PRN

## 2012-06-18 MED ORDER — ONDANSETRON HCL 4 MG/2ML IJ SOLN
4.0000 mg | Freq: Three times a day (TID) | INTRAMUSCULAR | Status: DC | PRN
  Filled 2012-06-18: qty 2

## 2012-06-18 MED ORDER — SODIUM BICARBONATE 8.4 % IV SOLN
INTRAVENOUS | Status: DC | PRN
  Administered 2012-06-18: 5 mL via EPIDURAL

## 2012-06-18 MED ORDER — SENNOSIDES-DOCUSATE SODIUM 8.6-50 MG PO TABS: 2.0000 | ORAL_TABLET | Freq: Every day | ORAL | Status: DC

## 2012-06-18 MED ORDER — LIDOCAINE HCL 1 % IJ SOLN
INTRAMUSCULAR | Status: DC | PRN
  Administered 2012-06-18: 07:00:00

## 2012-06-18 MED ORDER — MEPERIDINE HCL 25 MG/ML IJ SOLN: 6.2500 mg | INTRAMUSCULAR | Status: DC | PRN

## 2012-06-18 MED ORDER — KETOROLAC TROMETHAMINE 30 MG/ML IJ SOLN
30.0000 mg | Freq: Four times a day (QID) | INTRAMUSCULAR | Status: DC | PRN
  Administered 2012-06-18: 30 mg via INTRAVENOUS

## 2012-06-18 MED ORDER — MEPERIDINE HCL 25 MG/ML IJ SOLN
INTRAMUSCULAR | Status: AC
  Filled 2012-06-18: qty 1

## 2012-06-18 MED ORDER — IBUPROFEN 600 MG PO TABS: 600.0000 mg | ORAL_TABLET | Freq: Four times a day (QID) | ORAL | Status: DC | PRN

## 2012-06-18 MED ORDER — LIDOCAINE HCL (PF) 1 % IJ SOLN
INTRAMUSCULAR | Status: DC | PRN
  Administered 2012-06-18 (×2): 4 mL

## 2012-06-18 MED ORDER — ACETAMINOPHEN 160 MG/5ML PO SOLN
975.0000 mg | Freq: Once | ORAL | Status: AC
  Administered 2012-06-18: 975 mg via ORAL
  Filled 2012-06-18: qty 40.6

## 2012-06-18 SURGICAL SUPPLY — 31 items
BENZOIN TINCTURE PRP APPL 2/3 (GAUZE/BANDAGES/DRESSINGS) ×2 IMPLANT
CHLORAPREP W/TINT 26ML (MISCELLANEOUS) ×2 IMPLANT
CLOTH BEACON ORANGE TIMEOUT ST (SAFETY) ×2 IMPLANT
CONTAINER PREFILL 10% NBF 15ML (MISCELLANEOUS) IMPLANT
ELECT REM PT RETURN 9FT ADLT (ELECTROSURGICAL) ×2
ELECTRODE REM PT RTRN 9FT ADLT (ELECTROSURGICAL) ×1 IMPLANT
EXTRACTOR VACUUM M CUP 4 TUBE (SUCTIONS) ×2 IMPLANT
GLOVE BIO SURGEON STRL SZ7.5 (GLOVE) ×6 IMPLANT
GLOVE BIOGEL PI IND STRL 7.5 (GLOVE) ×1 IMPLANT
GLOVE BIOGEL PI INDICATOR 7.5 (GLOVE) ×1
GOWN PREVENTION PLUS LG XLONG (DISPOSABLE) ×6 IMPLANT
KIT ABG SYR 3ML LUER SLIP (SYRINGE) IMPLANT
NEEDLE HYPO 22GX1.5 SAFETY (NEEDLE) IMPLANT
NEEDLE HYPO 25X5/8 SAFETYGLIDE (NEEDLE) IMPLANT
NS IRRIG 1000ML POUR BTL (IV SOLUTION) ×2 IMPLANT
PACK C SECTION WH (CUSTOM PROCEDURE TRAY) ×2 IMPLANT
RETRACTOR WND ALEXIS 25 LRG (MISCELLANEOUS) ×1 IMPLANT
RTRCTR WOUND ALEXIS 25CM LRG (MISCELLANEOUS) ×2
SLEEVE SCD COMPRESS KNEE MED (MISCELLANEOUS) IMPLANT
STRIP CLOSURE SKIN 1/2X4 (GAUZE/BANDAGES/DRESSINGS) ×2 IMPLANT
SUT CHROMIC 2 0 CT 1 (SUTURE) ×2 IMPLANT
SUT MNCRL AB 3-0 PS2 27 (SUTURE) ×2 IMPLANT
SUT PLAIN 0 NONE (SUTURE) IMPLANT
SUT PLAIN 2 0 XLH (SUTURE) ×2 IMPLANT
SUT VIC AB 0 CT1 36 (SUTURE) ×2 IMPLANT
SUT VIC AB 0 CTX 36 (SUTURE) ×3
SUT VIC AB 0 CTX36XBRD ANBCTRL (SUTURE) ×3 IMPLANT
SYR CONTROL 10ML LL (SYRINGE) IMPLANT
TOWEL OR 17X24 6PK STRL BLUE (TOWEL DISPOSABLE) ×4 IMPLANT
TRAY FOLEY CATH 14FR (SET/KITS/TRAYS/PACK) ×2 IMPLANT
WATER STERILE IRR 1000ML POUR (IV SOLUTION) ×2 IMPLANT

## 2012-06-18 NOTE — Op Note (Signed)
OPERATIVE NOTE  Patient's Name: [name]  Date of Birth: Oct 25, 1981   Medical Records Number: [id number]   Date of Operation: 06/18/2012   Preoperative diagnosis:  [redacted]w[redacted]d weeks gestation  failure to decend   Postoperative diagnosis:  [redacted]w[redacted]d weeks gestation  failure to decend  Pelvic adhesions  Procedure:  Repeat low transverse cesarean section  Lysis of adhesions  Surgeon:  Leonard Schwartz, M.D.  Assistant:  Sanda Klein, certified nurse midwife  Anesthesia:  Regional  Disposition:  [name] is a 31 y.o. female, gravida 3 para 1 -0 -1-1, who presents at [redacted]w[redacted]d weeks gestation. The patient has been followed at the [facility] obstetrics and gynecology division of [facility] health care for women. This pregnancy has been complicated by a prior cesarean section. The patient attempted a vaginal delivery. She became completely dilated. She was unable to deliver after pushing for greater than 2 hours. She understands the indications for her procedure and she accepts the risk of, but not limited to, anesthetic complications, bleeding, infections, and possible damage to the surrounding organs.  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  The Apgar scores were 6/7/9. The fallopian tubes, and ovaries were normal for the gravid state. There were dense adhesions present from the anterior uterus and the anterior peritoneum. The arterial cord blood pH was 7.15. The venous cord blood pH was 7.26.  Procedure:  The patient was taken to the operating room where the epidural she had received her labor was felt to be adequate for cesarean delivery. The patient's abdomen was prepped with Chloraprep.  A Foley catheter was previously placed in the bladder. The patient was sterilely draped. The lower abdomen was injected with half percent Marcaine with epinephrine. A low transverse incision was made in the abdomen  and carried sharply through the subcutaneous tissue, the fascia, and the anterior peritoneum. Dense adhesions were encountered between the anterior peritoneum and the anterior uterus. The bowel was packed out of the pelvis. Sharp dissection was required to remove the adhesions so that we could access the lower uterine segment. An incision was made in the lower uterine segment. The incision was extended in a low transverse fashion. The membranes were ruptured. The fetal head was deep in the pelvis. We had difficulty elevating the fetal head. Once the fetal head was elevated we applied a Kiwi vacuum extractor. The fetal head was delivered without difficulty. The mouth and nose were suctioned. The remainder of the infant was then delivered. The cord was clamped and cut. The infant was handed to the awaiting pediatric team. The placenta was removed. The uterine cavity was cleaned of amniotic fluid, clotted blood, and membranes. The uterine incision was closed using a running locking suture of 0 Vicryl. An imbricating suture of 0 Vicryl was placed. Figure-of-eight sutures of 0 Vicryl were required for hemostasis. The pelvis was vigorously irrigated. Hemostasis was adequate. The anterior peritoneum and the abdominal musculature were closed using 0 Vicryl. The fascia was closed using a running suture of 0 Vicryl followed by 3 interrupted sutures of 0 Vicryl. The skin was reapproximated using a subcuticular suture of 3-0 Monocryl. Sponge, needle, and instrument counts were correct on 2 occasions. The estimated blood loss for the procedure was 1000 cc. The patient tolerated her procedure well. She was transported to the recovery room in stable condition. The infant was taken to the full-term nursery in stable condition. The placenta was sent to pathology.  Leonard Schwartz, M.D.

## 2012-06-18 NOTE — Addendum Note (Signed)
Addendum  created 06/18/12 1645 by Shanon Payor, CRNA   Modules edited:Notes Section

## 2012-06-18 NOTE — Progress Notes (Signed)
Patient ID: [name], female   DOB: 06-03-1981, 31 y.o.   MRN: [id number] .Subjective:  Is comfortable w epidural, c/o itching, denies pressure  Objective: BP 153/66  Pulse 88  Temp 99.2 F (37.3 C) (Oral)  Resp 20  Ht 5\' 3"  (1.6 m)  Wt 174 lb (78.926 kg)  BMI 30.82 kg/m2  SpO2 100%  LMP 09/06/2011   FHT:  FHR: 150 bpm, variability: moderate,  accelerations:  Present,  decelerations:  Present occ variables, delel to 110's while pt on back resolved w pos change UC:   regular, every 2 minutes SVE:   Dilation: 10 Effacement (%): 100 Station: 0 Exam by:: s. Vieva Brummitt cnm  Some caput noted  Attempt to place IUPC, unable to maneuver around vtx  Assessment / Plan: minimal cervical change and fetal descent Pt given options of proceeding w repeat c/s now or rechecking cervix in 1-2 hour Pt elects to observe at present    Fetal Wellbeing:  Category II Pain Control:  Epidural  Dr Su Hilt updated  Sanda Klein M 06/18/2012, 3:52 AM

## 2012-06-18 NOTE — Transfer of Care (Signed)
Immediate Anesthesia Transfer of Care Note  Patient: [name]  Procedure(s) Performed: Procedure(s) (LRB): CESAREAN SECTION (N/A)  Patient Location: PACU  Anesthesia Type: Epidural  Level of Consciousness: awake, alert  and oriented  Airway & Oxygen Therapy: Patient Spontanous Breathing  Post-op Assessment: Report given to PACU RN and Post -op Vital signs reviewed and stable  Post vital signs: Reviewed and stable  Complications: No apparent anesthesia complications

## 2012-06-18 NOTE — Anesthesia Postprocedure Evaluation (Signed)
Anesthesia Post Note  Patient: [name]  Procedure(s) Performed: Procedure(s) (LRB): CESAREAN SECTION (N/A)  Anesthesia type: Spinal  Patient location: PACU  Post pain: Pain level controlled  Post assessment: Post-op Vital signs reviewed  Last Vitals:  Filed Vitals:   06/18/12 0845  BP: 108/59  Pulse: 93  Temp:   Resp: 16    Post vital signs: Reviewed  Level of consciousness: awake  Complications: No apparent anesthesia complications

## 2012-06-18 NOTE — Progress Notes (Signed)
Subjective: Postpartum Day 0: Cesarean Delivery Patient reports nausea and vomiting.    Objective: Vital signs in last 24 hours: Temp:  [98 F (36.7 C)-100.6 F (38.1 C)] 99 F (37.2 C) (06/19 1445) Pulse Rate:  [64-120] 89  (06/19 1445) Resp:  [16-20] 16  (06/19 1445) BP: (104-170)/(56-154) 122/79 mmHg (06/19 1445) SpO2:  [88 %-100 %] 99 % (06/19 1445) Weight:  [78.926 kg (174 lb)] 78.926 kg (174 lb) (06/18 2110)  Physical Exam:  General: alert, cooperative and no distress Lochia: appropriate Uterine Fundus: firm Incision: dressing is clean and dry DVT Evaluation: No evidence of DVT seen on physical exam.   Basename 06/17/12 2050  HGB 10.7*  HCT 32.0*    Assessment/Plan: Status post Cesarean section. Postoperative course complicated by N and V  Increase fluids. Watch output.  Janine Limbo 06/18/2012, 3:28 PM

## 2012-06-18 NOTE — Anesthesia Postprocedure Evaluation (Signed)
  Anesthesia Post-op Note  Patient: [name]  Procedure(s) Performed: Procedure(s) (LRB): CESAREAN SECTION (N/A)  Patient Location: Mother/Baby  Anesthesia Type: Epidural  Level of Consciousness: awake, alert  and oriented  Airway and Oxygen Therapy: Patient Spontanous Breathing  Post-op Pain: none  Post-op Assessment: Post-op Vital signs reviewed and Patient's Cardiovascular Status Stable  Post-op Vital Signs: Reviewed and stable  Complications: No apparent anesthesia complications

## 2012-06-18 NOTE — Progress Notes (Signed)
Patient ID: [name], female   DOB: 1981-05-11, 31 y.o.   MRN: [id number] .Subjective:  Pt getting relief of pain w epidural denies pressure, but is still very tense  Objective: BP 118/87  Pulse 75  Temp 98 F (36.7 C) (Oral)  Resp 18  Ht 5\' 3"  (1.6 m)  Wt 174 lb (78.926 kg)  BMI 30.82 kg/m2  SpO2 98%  LMP 09/06/2011   FHT:  FHR: 130 bpm, variability: moderate,  accelerations:  Present,  decelerations:  Present variables UC:   spont tachysystole,  SVE:   Dilation: Lip/rim Effacement (%): 100 Station: 0 Exam by:: s. Rubens Cranston cnm  Terbutaline .25sq given IVF bolus and pt repositioned   Assessment / Plan: Spontaneous labor, progressing normally, TOLAC spont tachysystole, appears to be resolving after terb FHR overall reassuring  Will allow for labor down, await pt urge to push  GBS neg Vitals stable  Fetal Wellbeing:  Category II Pain Control:  Epidural  Dr Su Hilt updated CTO closely  [name] M 06/18/2012, 1:20 AM

## 2012-06-18 NOTE — Anesthesia Procedure Notes (Signed)
Epidural Patient location during procedure: OB Start time: 06/18/2012 12:12 AM  Staffing Anesthesiologist: Zander Ingham A.  Preanesthetic Checklist Completed: patient identified, site marked, surgical consent, pre-op evaluation, timeout performed, IV checked, risks and benefits discussed and monitors and equipment checked  Epidural Patient position: sitting Prep: site prepped and draped and DuraPrep Patient monitoring: continuous pulse ox and blood pressure Approach: midline Injection technique: LOR air  Needle:  Needle type: Tuohy  Needle gauge: 17 G Needle length: 9 cm Needle insertion depth: 4 cm Catheter type: closed end flexible Catheter size: 19 Gauge Catheter at skin depth: 9 cm Test dose: negative and Other  Assessment Events: blood not aspirated, injection not painful, no injection resistance, negative IV test and no paresthesia  Additional Notes Patient identified. Risks and benefits discussed including failed block, incomplete  Pain control, post dural puncture headache, nerve damage, paralysis, blood pressure Changes, nausea, vomiting, reactions to medications-both toxic and allergic and post Partum back pain. All questions were answered. Patient expressed understanding and wished to proceed. Sterile technique was used throughout procedure. Epidural site was Dressed with sterile barrier dressing. No paresthesias, signs of intravascular injection Or signs of intrathecal spread were encountered.  Patient was more comfortable after the epidural was dosed. Please see RN's note for documentation of vital signs and FHR which are stable.

## 2012-06-18 NOTE — Progress Notes (Signed)
Patient ID: KAROLYNN INFANTINO, female   DOB: [birth year], 31 y.o.   MRN: [id number] .Subjective:  Remains comfortable w epidural, feels some pressure on r side  Objective: BP 117/70  Pulse 100  Temp 100 F (37.8 C) (Axillary)  Resp 18  Ht 5\' 3"  (1.6 m)  Wt 174 lb (78.926 kg)  BMI 30.82 kg/m2  SpO2 100%  LMP 09/06/2011   FHT:  FHR: 150 bpm, variability: moderate,  accelerations:  Present,  decelerations:  Present occ variable/occ late UC:   regular, every 2 minutes SVE:   Dilation: 10 Effacement (%): 100 Station: 0 Exam by:: s. Maday Guarino cnm  More caput noted  Assessment / Plan: Arrest of decent GBS neg,    Fetal Wellbeing:  Category II Pain Control:  Epidural  D/W Dr Su Hilt, pt ready to proceed w repeat c/s. Rv'd R/B including but not limited to: 1. Infection 2. Bleeding 3. Anesthesia complications 4. Damage to internal organs.   Deiona Hooper M 06/18/2012, 5:24 AM

## 2012-06-18 NOTE — Progress Notes (Signed)
c-section called. Pt agreed

## 2012-06-19 ENCOUNTER — Encounter (HOSPITAL_COMMUNITY): Payer: Self-pay | Admitting: Obstetrics and Gynecology

## 2012-06-19 ENCOUNTER — Other Ambulatory Visit: Payer: BC Managed Care – PPO

## 2012-06-19 ENCOUNTER — Encounter: Payer: BC Managed Care – PPO | Admitting: Obstetrics and Gynecology

## 2012-06-19 LAB — CBC
HCT: 19.7 % — ABNORMAL LOW (ref 36.0–46.0)
Hemoglobin: 6.8 g/dL — CL (ref 12.0–15.0)
RBC: 2.18 MIL/uL — ABNORMAL LOW (ref 3.87–5.11)
RDW: 14.2 % (ref 11.5–15.5)
WBC: 11 10*3/uL — ABNORMAL HIGH (ref 4.0–10.5)

## 2012-06-19 MED ORDER — COMPLETENATE 29-1 MG PO CHEW
1.0000 | CHEWABLE_TABLET | Freq: Every day | ORAL | Status: DC
  Administered 2012-06-19 – 2012-06-21 (×3): 1 via ORAL
  Filled 2012-06-19 (×3): qty 1

## 2012-06-19 NOTE — Progress Notes (Addendum)
Subjective: Postpartum Day 1 Cesarean Delivery Patient reports tolerating PO, + flatus and no problems voiding.  Comfortable with pain meds.  Objective: Results for orders placed during the hospital encounter of 06/17/12 (from the past 48 hour(s))  CBC     Status: Abnormal   Collection Time   06/17/12  8:50 PM      Component Value Range Comment   WBC 10.0  4.0 - 10.5 K/uL    RBC 3.52 (*) 3.87 - 5.11 MIL/uL    Hemoglobin 10.7 (*) 12.0 - 15.0 g/dL    HCT 62.1 (*) 30.8 - 46.0 %    MCV 90.9  78.0 - 100.0 fL    MCH 30.4  26.0 - 34.0 pg    MCHC 33.4  30.0 - 36.0 g/dL    RDW 65.7  84.6 - 96.2 %    Platelets 142 (*) 150 - 400 K/uL   RPR     Status: Normal   Collection Time   06/17/12  8:50 PM      Component Value Range Comment   RPR NON REACTIVE  NON REACTIVE   TYPE AND SCREEN     Status: Normal   Collection Time   06/17/12 11:10 PM      Component Value Range Comment   ABO/RH(D) A POS      Antibody Screen NEG      Sample Expiration 06/20/2012     ABO/RH     Status: Normal   Collection Time   06/17/12 11:10 PM      Component Value Range Comment   ABO/RH(D) A POS     CBC     Status: Abnormal   Collection Time   06/19/12  5:25 AM      Component Value Range Comment   WBC 11.0 (*) 4.0 - 10.5 K/uL    RBC 2.18 (*) 3.87 - 5.11 MIL/uL    Hemoglobin 6.8 (*) 12.0 - 15.0 g/dL    HCT 95.2 (*) 84.1 - 46.0 %    MCV 90.4  78.0 - 100.0 fL    MCH 31.2  26.0 - 34.0 pg    MCHC 34.5  30.0 - 36.0 g/dL    RDW 32.4  40.1 - 02.7 %    Platelets 92 (*) 150 - 400 K/uL      Vital signs in last 24 hours:BP 102/63  Pulse 91  Temp 97.6 F (36.4 C) (Oral)  Resp 18  Ht 5\' 3"  (1.6 m)  Wt 174 lb (78.926 kg)  BMI 30.82 kg/m2  SpO2 99%  LMP 09/06/2011  Breastfeeding? Unknown Orthostatics wnl  Physical Exam:  General: alert, cooperative and no distress Lochia: appropriate Uterine Fundus: firm Incision: healing well, dressing dry DVT Evaluation: Negative Homan's sign. No significant calf/ankle  edema. Lungs clear bilaterally AP RRR Bowel sounds active abd hypoactive  A normal involution     Lactating     PO day 1     Normal involution     Anemia  P discussed anemia blood transfusion risks benefits, continue care, collaboration with Dr. Pennie Rushing. Lavera Guise, CNM  I reviewed with patient and her husband the risks and benefits of transfusion.  She and her nurse state that she is ambulating well without symptoms of syncope or dizziness.  She declines transfusion at this time.

## 2012-06-20 NOTE — Progress Notes (Signed)
Subjective: Postpartum Day 2 Cesarean Delivery Patient reports tolerating PO, + flatus and no problems voiding.  Not dizzy when up  Objective:   Vital signs in last 24 hours:  Physical Exam:  General: alert, cooperative and no distress Lochia: appropriate Uterine Fundus: firm Incision: dressing dry DVT Evaluation: Negative Homan's sign. +1 edema lower legs Lungs clear bilaterally AP RRR Bowel sounds active abd slightly distended Hemoglobin & Hematocrit     Component Value Date/Time   HGB 6.8* 06/19/2012 0525   HCT 19.7* 06/19/2012 0525    A normal involution     Lactating     PO day 2     anemia     Normal involution P plans depo reminded will take dressing off in shower to day, encouraged ambulation, continue care Lavera Guise, CNM

## 2012-06-21 MED ORDER — OXYCODONE-ACETAMINOPHEN 5-325 MG PO TABS: ORAL_TABLET | ORAL | Status: DC

## 2012-06-21 MED ORDER — DOCUSATE SODIUM 50 MG/5ML PO LIQD: 100.0000 mg | Freq: Two times a day (BID) | ORAL | Status: AC

## 2012-06-21 MED ORDER — IBUPROFEN 100 MG/5ML PO SUSP: 600.0000 mg | Freq: Four times a day (QID) | ORAL | Status: DC | PRN

## 2012-06-21 MED ORDER — MEDROXYPROGESTERONE ACETATE 150 MG/ML IM SUSP: 150.0000 mg | Freq: Once | INTRAMUSCULAR | Status: DC

## 2012-06-21 NOTE — Discharge Summary (Signed)
Physician Discharge Summary  Patient ID: [name] MRN: [id number] DOB/AGE: Aug 23, 1981 30 y.o.  Admit date: 06/17/2012 Discharge date: 06/21/2012  Admission Diagnoses: 40 4/7 week IUP active labor  Discharge Diagnoses:  Active Problems:  History of cesarean section  Anemia C/S  Discharged Condition: stable  Hospital Course: active labor, repeat C/S failure of descent, anemia, refused blood transfusion, normal involution  Consults: None  Significant Diagnostic Studies: labs:  Results for orders placed during the hospital encounter of 06/17/12 (from the past 72 hour(s))  CBC     Status: Abnormal   Collection Time   06/19/12  5:25 AM      Component Value Range Comment   WBC 11.0 (*) 4.0 - 10.5 K/uL    RBC 2.18 (*) 3.87 - 5.11 MIL/uL    Hemoglobin 6.8 (*) 12.0 - 15.0 g/dL    HCT 40.9 (*) 81.1 - 46.0 %    MCV 90.4  78.0 - 100.0 fL    MCH 31.2  26.0 - 34.0 pg    MCHC 34.5  30.0 - 36.0 g/dL    RDW 91.4  78.2 - 95.6 %    Platelets 92 (*) 150 - 400 K/uL     Treatments: IV hydration  Discharge Exam: Blood pressure 120/73, pulse 72, temperature 97.6 F (36.4 C), temperature source Oral, resp. rate 18, height 5\' 3"  (1.6 m), weight 174 lb (78.926 kg), last menstrual period 09/06/2011, SpO2 98.00%, unknown if currently breastfeeding. General appearance: alert, cooperative and no distress Calm, no distress, HEENT WNL, breasts bilaterally no dimpling, masses, or drainage lungs clear bilaterally, AP RRR, abd soft nt,no masses, not tympanic bowel sounds active, abdomen nontender, incision well approximated no redness, edema, or drainage, FF sm serosa flow trace edema to lower extremities VSS S no N,V, no pain with void, no bm yet, no dizziness, wants depo today Disposition: 01-Home or Self Care  Discharge Orders    Future Orders Please Complete By Expires   OB RESULTS CONSOLE RPR      Comments:   This external order was created through the Results Console.   OB RESULTS  CONSOLE HIV antibody      Comments:   This external order was created through the Results Console.   OB RESULTS CONSOLE Rubella Antibody      Comments:   This external order was created through the Results Console.   OB RESULTS CONSOLE Hepatitis B surface antigen      Comments:   This external order was created through the Results Console.   OB RESULTS CONSOLE ABO/Rh      Comments:   This external order was created through the Results Console.   OB RESULTS CONSOLE Antibody Screen      Comments:   This external order was created through the Results Console.     Medication List  As of 06/21/2012  2:13 PM   TAKE these medications         docusate 50 MG/5ML liquid   Commonly known as: COLACE   Take 10 mLs (100 mg total) by mouth 2 (two) times daily.      ibuprofen 100 MG/5ML suspension   Commonly known as: ADVIL,MOTRIN   Take 30 mLs (600 mg total) by mouth every 6 (six) hours as needed for fever.      medroxyPROGESTERone 150 MG/ML injection   Commonly known as: DEPO-PROVERA   Inject 1 mL (150 mg total) into the muscle once.      OVER THE COUNTER MEDICATION  Take 10 mLs by mouth 2 (two) times daily. FLORADIX, otc liquid iron supplement      oxyCODONE-acetaminophen 5-325 MG per tablet   Commonly known as: PERCOCET   Give liquid 5 mg dose      prenatal multivitamin Tabs   Take 1 tablet by mouth at bedtime.           Follow-up Information    Follow up with [facility] in 6 weeks.        Discussed risks of constipation encouraged enema prior to discharge Signed: Lavera Guise 06/21/2012, 2:13 PM

## 2012-06-21 NOTE — Discharge Instructions (Signed)
Cesarean Delivery  °Cesarean delivery is the birth of a baby through a cut (incision) in the abdomen and womb (uterus).  °LET YOUR CAREGIVER KNOW ABOUT: °· Complications involving the pregnancy.  °· Allergies.  °· Medicines taken including herbs, eyedrops, over-the-counter medicines, and creams.  °· Use of steroids (by mouth or creams).  °· Previous problems with anesthetics or numbing medicine.  °· Previous surgery.  °· History of blood clots.  °· History of bleeding or blood problems.  °· Other health problems.  °RISKS AND COMPLICATIONS  °· Bleeding.  °· Infection.  °· Blood clots.  °· Injury to surrounding organs.  °· Anesthesia problems.  °· Injury to the baby.  °BEFORE THE PROCEDURE  °· A tube (Foley catheter) will be placed in your bladder. The Foley catheter drains the urine from your bladder into a bag. This keeps your bladder empty during surgery.  °· An intravenous access tube (IV) will be placed in your arm.  °· Hair may be removed from your pubic area and your lower abdomen. This is to prevent infection in the incision site.  °· You may be given an antacid medicine to drink. This will prevent acid contents in your stomach from going into your lungs if you vomit during the surgery.  °· You may be given an antibiotic medicine to prevent infection.  °PROCEDURE  °· You may be given medicine to numb the lower half of your body (regional anesthetic). If you were in labor, you may have already had an epidural in place which can be used in both labor and cesarean delivery. You may possibly be given medicine to make you sleep (general anesthetic) though this is not as common.  °· An incision will be made in your abdomen that extends to your uterus. There are 2 basic kinds of incisions:  °· The horizontal (transverse) incision. Horizontal incisions are used for most routine cesarean deliveries.  °· The vertical (up and down) incision. This is less commonly used. This is most often reserved for women who have a  serious complication (extreme prematurity) or under emergency situations.   °· The horizontal and vertical incisions may both be used at the same time. However, this is very uncommon.  °· Your baby will then be delivered.  °AFTER THE PROCEDURE  °· If you were awake during the surgery, you will see your baby right away. If you were asleep, you will see your baby as soon as you are awake.  °· You may breastfeed your baby after surgery.  °· You may be able to get up and walk the same day as the surgery. If you need to stay in bed for a period of time, you will receive help to turn, cough, and take deep breaths after surgery. This helps prevent lung problems such as pneumonia.  °· Do not get out of bed alone the first time after surgery. You will need help getting out of bed until you are able to do this by yourself.  °· You may be able to shower the day after your cesarean delivery.  After the bandage (dressing) is taken off the incision site, a nurse will assist you to shower, if you like.   °· You will have pneumatic compressing hose placed on your feet or lower legs. These hose are used to prevent blood clots. When you are up and walking regularly, they will no longer be necessary.   °· Do not cross your legs when you sit.  °· Save any blood clots that you   pass. If you pass a clot while on the toilet, do not flush it. Call for the nurse. Tell the nurse if you think you are bleeding too much or passing too many clots.  °· Start drinking liquids and eating food as directed by your caregiver. If your stomach is not ready, drinking and eating too soon can cause an increase in bloating and swelling of your intestine and abdomen. This is very uncomfortable.  °· You will be given medicine as needed. Let your caregivers know if you are hurting. They want you to be comfortable. You may also be given an antibiotic to prevent an infection.  °· Your IV will be taken out when you are drinking a reasonable amount of fluids. The  Foley catheter is taken out when you are up and walking.  °· If your blood type is Rh negative and your baby's blood type is Rh positive, you will be given a shot of anti-D immune globulin. This shot prevents you from having Rh problems with a future pregnancy. You should get the shot even if you had your tubes tied (tubal ligation).  °· If you are allowed to take the baby for a walk, place the baby in the bassinet and push it. Do not carry your baby in your arms.  °Document Released: 12/17/2005 Document Revised: 12/06/2011 Document Reviewed: 04/13/2011 °[employer]® Patient Information ©2012 [employer]. °

## 2012-08-04 ENCOUNTER — Ambulatory Visit (INDEPENDENT_AMBULATORY_CARE_PROVIDER_SITE_OTHER): Payer: BC Managed Care – PPO | Admitting: Obstetrics and Gynecology

## 2012-08-04 ENCOUNTER — Encounter: Payer: Self-pay | Admitting: Obstetrics and Gynecology

## 2012-08-04 DIAGNOSIS — Z309 Encounter for contraceptive management, unspecified: Secondary | ICD-10-CM

## 2012-08-04 DIAGNOSIS — IMO0001 Reserved for inherently not codable concepts without codable children: Secondary | ICD-10-CM

## 2012-08-04 MED ORDER — MEDROXYPROGESTERONE ACETATE 150 MG/ML IM SUSP: 150.0000 mg | Freq: Once | INTRAMUSCULAR | Status: DC

## 2012-08-04 NOTE — Progress Notes (Addendum)
Date of delivery: 06/18/2012 Female Name: [name]  Vaginal delivery:no Cesarean section:yes Tubal ligation:no GDM:no Breast Feeding:yes Bottle Feeding:no Post-Partum Blues:no Abnormal pap:no Normal GU function: no Normal GI function:no Returning to work:yes 08/19/2012 EPDS: 0  Subjective:     [name] is a 31 y.o. female who presents for a postpartum visit.  I have fully reviewed the prenatal and intrapartum course. See information above.    Patient is not sexually active.   The following portions of the patient's history were reviewed and updated as appropriate: allergies, current medications, past family history, past medical history, past social history, past surgical history and problem list.  Review of Systems Pertinent items are noted in HPI.   Objective:    BP 120/72  Wt 144 lb (65.318 kg)  Breastfeeding? No  General:  alert, cooperative and no distress     Lungs: clear to auscultation bilaterally  Heart:  regular rate and rhythm, S1, S2 normal, no murmur  Abdomen: soft, non-tender; bowel sounds normal; no masses,  no organomegaly   Vulva:  normal  Vagina: normal vagina  Cervix:  normal  Corpus: normal size, contour, position, consistency, mobility, non-tender  Adnexa:  normal adnexa        Cesarean section incision well healed: yes       Assessment:     Normal postpartum exam.  Pap smear not done at today's visit.   Plan:     1. Contraception: Depo-Provera injections. 2. EPDS: 0. 3. Follow up in: 2 month or as needed. 4. Breast Feeding: Yes. 5. Return to work, exercising, and a normal activities: yes, end of this month.  Janine Limbo MD 08/04/2012 6:25 PM

## 2012-09-10 ENCOUNTER — Other Ambulatory Visit (INDEPENDENT_AMBULATORY_CARE_PROVIDER_SITE_OTHER): Payer: BC Managed Care – PPO

## 2012-09-10 DIAGNOSIS — Z3009 Encounter for other general counseling and advice on contraception: Secondary | ICD-10-CM

## 2012-09-10 MED ORDER — MEDROXYPROGESTERONE ACETATE 150 MG/ML IM SUSP
150.0000 mg | Freq: Once | INTRAMUSCULAR | Status: AC
  Administered 2012-09-10: 150 mg via INTRAMUSCULAR

## 2012-09-10 NOTE — Progress Notes (Unsigned)
Next Depo due 12/02/2012.  Pt made aware.  Depo provera Injection given w/o any difficulties into Left Deltoid.  [facility] CMA

## 2012-12-04 ENCOUNTER — Other Ambulatory Visit (INDEPENDENT_AMBULATORY_CARE_PROVIDER_SITE_OTHER): Payer: BC Managed Care – PPO

## 2012-12-04 DIAGNOSIS — Z3009 Encounter for other general counseling and advice on contraception: Secondary | ICD-10-CM

## 2012-12-04 MED ORDER — MEDROXYPROGESTERONE ACETATE 150 MG/ML IM SUSP
150.0000 mg | Freq: Once | INTRAMUSCULAR | Status: AC
  Administered 2012-12-04: 150 mg via INTRAMUSCULAR

## 2012-12-04 NOTE — Progress Notes (Unsigned)
Next Depo due 02-25-2013

## 2013-03-20 ENCOUNTER — Ambulatory Visit (INDEPENDENT_AMBULATORY_CARE_PROVIDER_SITE_OTHER): Payer: BC Managed Care – PPO | Admitting: Family Medicine

## 2013-03-20 VITALS — BP 122/86 | HR 79 | Temp 98.6°F | Resp 16 | Ht 62.5 in | Wt 143.0 lb

## 2013-03-20 DIAGNOSIS — R109 Unspecified abdominal pain: Secondary | ICD-10-CM

## 2013-03-20 DIAGNOSIS — K911 Postgastric surgery syndromes: Secondary | ICD-10-CM

## 2013-03-20 LAB — POCT CBC
MCH, POC: 29.9 pg (ref 27–31.2)
MCV: 94.6 fL (ref 80–97)
MID (cbc): 0.4 (ref 0–0.9)
POC LYMPH PERCENT: 39.7 %L (ref 10–50)
Platelet Count, POC: 255 10*3/uL (ref 142–424)
RDW, POC: 13.5 %
WBC: 7.1 10*3/uL (ref 4.6–10.2)

## 2013-03-20 LAB — POCT URINALYSIS DIPSTICK
Bilirubin, UA: NEGATIVE
Ketones, UA: NEGATIVE

## 2013-03-20 LAB — POCT UA - MICROSCOPIC ONLY: Epithelial cells, urine per micros: NEGATIVE

## 2013-03-20 LAB — COMPREHENSIVE METABOLIC PANEL
AST: 17 U/L (ref 0–37)
Alkaline Phosphatase: 41 U/L (ref 39–117)
BUN: 9 mg/dL (ref 6–23)
Calcium: 9 mg/dL (ref 8.4–10.5)
Creat: 0.66 mg/dL (ref 0.50–1.10)

## 2013-03-20 MED ORDER — DICYCLOMINE HCL 10 MG PO CAPS: 10.0000 mg | ORAL_CAPSULE | Freq: Three times a day (TID) | ORAL | Status: DC

## 2013-03-20 NOTE — Patient Instructions (Signed)
Avoid foods that you no ear take you  Take the Bentyl (dicyclomine) before meals and at bedtime as needed. If he noted that you're not having problems in the night or not having major problems from a particular kind of meal urinating you do not need to take the medicine. If it causes constipation cut back on it.  Return at any time if acutely worse, or in 3-4 weeks if you're just not improving significantly.

## 2013-03-20 NOTE — Progress Notes (Signed)
Subjective: 32 year old lady who has been having some symptoms for the past several weeks. She finds that when she eats spicy foods that she has a dumping syndrome with cramping and bowels moving. She has not noticed this particularly bad with fried foods, although she did have some cramping abdominal discomfort after eating some fried fish the other night but did not have the diarrhea then. She has not seen any blood in her stools she is on the Depo-Provera and her menses are irregular. Last one being about 3 weeks ago. She has 2 children, the youngest is about 8 months. She is employed as a Runner, broadcasting/film/video. She has been having for some time some milk intolerance and and other food intolerances. She's never been told she having problems with her gallbladder. She had 2 cesarean sections, no other abdominal surgeries.  Objective: Healthy appearing lady in no severe distress. No CVA tenderness. Abdomen had normal bowel sounds, soft without organomegaly or masses. Has some vague periumbilical tenderness. Skin unremarkable.  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 Met Results for orders placed in visit on 03/20/13  POCT UA - MICROSCOPIC ONLY      Result Value Range   WBC, Ur, HPF, POC 0-1     RBC, urine, microscopic 0-2     Bacteria, U Microscopic TRACE     Mucus, UA NEG     Epithelial cells, urine per micros NEG     Crystals, Ur, HPF, POC NEG     Casts, Ur, LPF, POC NEG     Yeast, UA NEG    POCT URINALYSIS DIPSTICK      Result Value Range   Color, UA YELLOW     Clarity, UA CLEAR     Glucose, UA NEG     Bilirubin, UA NEG     Ketones, UA NEG     Spec Grav, UA 1.020     Blood, UA NEG     pH, UA 7.0     Protein, UA NEG     Urobilinogen, UA 0.2     Nitrite, UA NEG     Leukocytes, UA Trace    POCT CBC      Result Value Range   WBC 7.1  4.6 - 10.2 K/uL   Lymph, poc 2.8  0.6 - 3.4   POC LYMPH PERCENT 39.7  10 - 50 %L   MID (cbc)  0.4  0 - 0.9   POC MID % 5.9  0 - 12 %M   POC Granulocyte 3.9  2 - 6.9   Granulocyte percent 54.4  37 - 80 %G   RBC 4.68  4.04 - 5.48 M/uL   Hemoglobin 14.0  12.2 - 16.2 g/dL   HCT, POC 16.1  09.6 - 47.9 %   MCV 94.6  80 - 97 fL   MCH, POC 29.9  27 - 31.2 pg   MCHC 31.6 (*) 31.8 - 35.4 g/dL   RDW, POC 04.5     Platelet Count, POC 255  142 - 424 K/uL   MPV 7.6  0 - 99.8 fL

## 2013-04-06 ENCOUNTER — Emergency Department (HOSPITAL_COMMUNITY)
Admission: EM | Admit: 2013-04-06 | Discharge: 2013-04-06 | Disposition: A | Discharge status: Home / Self Care | Payer: BC Managed Care – PPO | Attending: Emergency Medicine | Admitting: Emergency Medicine

## 2013-04-06 ENCOUNTER — Encounter (HOSPITAL_COMMUNITY): Payer: Self-pay | Admitting: *Deleted

## 2013-04-06 DIAGNOSIS — R109 Unspecified abdominal pain: Secondary | ICD-10-CM | POA: Insufficient documentation

## 2013-04-06 DIAGNOSIS — Z862 Personal history of diseases of the blood and blood-forming organs and certain disorders involving the immune mechanism: Secondary | ICD-10-CM | POA: Insufficient documentation

## 2013-04-06 DIAGNOSIS — R112 Nausea with vomiting, unspecified: Secondary | ICD-10-CM | POA: Insufficient documentation

## 2013-04-06 DIAGNOSIS — Z79899 Other long term (current) drug therapy: Secondary | ICD-10-CM | POA: Insufficient documentation

## 2013-04-06 DIAGNOSIS — Z3202 Encounter for pregnancy test, result negative: Secondary | ICD-10-CM | POA: Insufficient documentation

## 2013-04-06 LAB — URINALYSIS, ROUTINE W REFLEX MICROSCOPIC
Bilirubin Urine: NEGATIVE
Glucose, UA: NEGATIVE mg/dL
Hgb urine dipstick: NEGATIVE
Ketones, ur: NEGATIVE mg/dL
Specific Gravity, Urine: 1.031 — ABNORMAL HIGH (ref 1.005–1.030)
pH: 6.5 (ref 5.0–8.0)

## 2013-04-06 LAB — COMPREHENSIVE METABOLIC PANEL
ALT: 12 U/L (ref 0–35)
AST: 18 U/L (ref 0–37)
Alkaline Phosphatase: 53 U/L (ref 39–117)
Calcium: 9.4 mg/dL (ref 8.4–10.5)
Potassium: 3.9 mEq/L (ref 3.5–5.1)
Sodium: 137 mEq/L (ref 135–145)
Total Protein: 8.1 g/dL (ref 6.0–8.3)

## 2013-04-06 LAB — CBC WITH DIFFERENTIAL/PLATELET
Basophils Absolute: 0 10*3/uL (ref 0.0–0.1)
Eosinophils Absolute: 0.1 10*3/uL (ref 0.0–0.7)
Eosinophils Relative: 2 % (ref 0–5)
HCT: 39.4 % (ref 36.0–46.0)
Hemoglobin: 13.9 g/dL (ref 12.0–15.0)
Lymphocytes Relative: 39 % (ref 12–46)
Lymphs Abs: 2.8 10*3/uL (ref 0.7–4.0)
MCH: 31.6 pg (ref 26.0–34.0)
Neutrophils Relative %: 54 % (ref 43–77)
Platelets: 238 10*3/uL (ref 150–400)
RDW: 12.8 % (ref 11.5–15.5)

## 2013-04-06 LAB — URINE MICROSCOPIC-ADD ON

## 2013-04-06 MED ORDER — OMEPRAZOLE 20 MG PO CPDR: 20.0000 mg | DELAYED_RELEASE_CAPSULE | Freq: Every day | ORAL | Status: DC

## 2013-04-06 NOTE — ED Provider Notes (Signed)
History     CSN: [id number]  Arrival date & time 04/06/13  1523   First MD Initiated Contact with Patient 04/06/13 1627      Chief Complaint  Patient presents with  . Abdominal Pain    (Consider location/radiation/quality/duration/timing/severity/associated sxs/prior treatment) HPI Comments: Patient is a 32 year old female with a history of cesarean section who presents for upper abdominal pain x6 weeks. Patient states that her pain is sharp in sensation and present across her right and left upper quadrant and epigastric region; pain is nonradiating. Patient states that her pain lasts approximately 30 minutes, is aggravated with eating and alleviating after having a bowel movement. Patient has also tried decreasing her intake of spicy foods, which has provided moderate relief. Patient states that she wakes up around 2AM with the abdominal pain when it occurs, though it does not occur every night. Patient admits to associated nausea and 2 episodes of nonbloody, nonbilious emesis over the past month and a half. Patient denies fever, chest pain, SOB, diarrhea, melena, hematochezia, vaginal pain or discharge, and numbness or tingling in her extremities.  Patient is a 32 y.o. female presenting with abdominal pain. The history is provided by the patient. No language interpreter was used.  Abdominal Pain Associated symptoms: nausea and vomiting   Associated symptoms: no chest pain, no chills, no constipation, no cough, no diarrhea, no dysuria, no fever, no hematuria, no shortness of breath, no vaginal bleeding and no vaginal discharge     Past Medical History  Diagnosis Date  . No pertinent past medical history   . Normal pregnancy, incidental 05/01/2012  . Chicken pox   . Allergy   . Anemia     Past Surgical History  Procedure Laterality Date  . Cesarean section    . Dental surgery    . Cesarean section  06/18/2012    Procedure: CESAREAN SECTION;  Surgeon: Kirkland Hun, MD;  Location:  [facility];  Service: Gynecology;  Laterality: N/A;  with delivery of baby boy at 60    Family History  Problem Relation Age of Onset  . Cancer Maternal Grandfather   . Prostate cancer Maternal Grandfather     History  Substance Use Topics  . Smoking status: Never Smoker   . Smokeless tobacco: Never Used  . Alcohol Use: No    OB History   Grav Para Term Preterm Abortions TAB SAB Ect Mult Living   3 2 2  0 1 0 1 0 0 2      Review of Systems  Constitutional: Negative for fever and chills.  Respiratory: Negative for cough and shortness of breath.   Cardiovascular: Negative for chest pain.  Gastrointestinal: Positive for nausea, vomiting and abdominal pain. Negative for diarrhea, constipation and blood in stool.  Genitourinary: Negative for dysuria, hematuria, vaginal bleeding, vaginal discharge and vaginal pain.  Musculoskeletal: Negative for back pain.  Skin: Negative for color change and rash.  Neurological: Negative for dizziness, syncope, weakness, light-headedness and numbness.  All other systems reviewed and are negative.    Allergies  Shellfish allergy  Home Medications   Current Outpatient Rx  Name  Route  Sig  Dispense  Refill  . dicyclomine (BENTYL) 10 MG capsule   Oral   Take 1 capsule (10 mg total) by mouth 4 (four) times daily -  before meals and at bedtime.   40 capsule   1   . medroxyPROGESTERone (DEPO-PROVERA) 150 MG/ML injection   Intramuscular   Inject 150 mg into the muscle  every 3 (three) months.         Marland Kitchen omeprazole (PRILOSEC) 20 MG capsule   Oral   Take 1 capsule (20 mg total) by mouth daily.   30 capsule   0     BP 139/93  Pulse 72  Temp(Src) 98.3 F (36.8 C) (Oral)  Resp 20  SpO2 99%  Physical Exam  Nursing note and vitals reviewed. Constitutional: She is oriented to person, place, and time. She appears well-developed and well-nourished. No distress.  HENT:  Head: Normocephalic and atraumatic.  Mouth/Throat: Oropharynx is  clear and moist. No oropharyngeal exudate.  Eyes: Conjunctivae are normal. Pupils are equal, round, and reactive to light. No scleral icterus.  Neck: Normal range of motion. Neck supple.  Cardiovascular: Normal rate, regular rhythm, normal heart sounds and intact distal pulses.   Pulmonary/Chest: Effort normal and breath sounds normal. No respiratory distress. She has no wheezes. She has no rales.  Abdominal: Soft. Bowel sounds are normal. She exhibits no distension. There is no tenderness. There is no rebound and no guarding.  No peritoneal signs  Musculoskeletal: Normal range of motion. She exhibits no edema.  Lymphadenopathy:    She has no cervical adenopathy.  Neurological: She is alert and oriented to person, place, and time.  Skin: Skin is warm and dry. No rash noted. She is not diaphoretic. No erythema.  Psychiatric: She has a normal mood and affect. Her behavior is normal.    ED Course  Procedures (including critical care time)  Labs Reviewed  COMPREHENSIVE METABOLIC PANEL - Abnormal; Notable for the following:    Total Bilirubin 0.2 (*)    All other components within normal limits  URINALYSIS, ROUTINE W REFLEX MICROSCOPIC - Abnormal; Notable for the following:    Specific Gravity, Urine 1.031 (*)    Leukocytes, UA SMALL (*)    All other components within normal limits  CBC WITH DIFFERENTIAL  URINE MICROSCOPIC-ADD ON  LIPASE, BLOOD  POCT PREGNANCY, URINE   No results found.   1. Abdominal pain of unknown etiology      MDM  Patient is a 32 year old female who presents for 6 weeks of upper abdominal pain lasting approximately 30 minutes with onset a few hours after eating. Patient is asymptomatic at this time. She is well and nontoxic appearing, in no acute distress, and hemodynamically stable. Workup to include basic labs, lipase, and urinalysis, and urine pregnancy. Patient without focal abdominal tenderness or peritoneal signs on physical exam.  Patient tolerating  fluids by mouth and has remained asymptomatic throughout ED visit. Labs without significant findings. Patient stable for d/c with PCP follow up for further evaluation of symptoms. Will start patient on omeprazole trial as symptoms may be reflux related. Patient states comfort and understanding with this d/c plan with no unaddressed concerns. Indications for ED return discussed. Patient work up and Customer service manager discussed with Dr. Manus Gunning who is in agreement.        Antony Madura, PA-C 04/07/13 1317

## 2013-04-06 NOTE — ED Notes (Signed)
Pt reports one month worth of abdominal issues and was diagnosed with dumping syndrome and placed on bentyl.   Discomfort happens hours after eating.  Pt reports vomiting and diarrhea last nite.  Pt reports intermittent diarrhea.

## 2013-04-08 NOTE — ED Provider Notes (Signed)
Medical screening examination/treatment/procedure(s) were performed by non-physician practitioner and as supervising physician I was immediately available for consultation/collaboration.  Riad Wagley, MD 04/08/13 1011 

## 2014-03-17 ENCOUNTER — Ambulatory Visit (INDEPENDENT_AMBULATORY_CARE_PROVIDER_SITE_OTHER): Payer: BC Managed Care – PPO | Admitting: Physician Assistant

## 2014-03-17 VITALS — BP 120/74 | HR 98 | Temp 99.3°F | Resp 16 | Ht 62.25 in | Wt 146.0 lb

## 2014-03-17 DIAGNOSIS — J309 Allergic rhinitis, unspecified: Secondary | ICD-10-CM

## 2014-03-17 DIAGNOSIS — H1010 Acute atopic conjunctivitis, unspecified eye: Secondary | ICD-10-CM

## 2014-03-17 MED ORDER — AZELASTINE HCL 0.05 % OP SOLN: 1.0000 [drp] | Freq: Two times a day (BID) | OPHTHALMIC | Status: DC

## 2014-03-17 MED ORDER — MOMETASONE FUROATE 50 MCG/ACT NA SUSP: 4.0000 | Freq: Every day | NASAL | Status: DC

## 2014-03-17 MED ORDER — PREDNISOLONE 15 MG/5ML PO SOLN: 40.0000 mg | Freq: Every day | ORAL | Status: DC

## 2014-03-17 NOTE — Patient Instructions (Signed)
Take the cetirizine (Zyrtec) once daily - make sure you are getting a 10mg  dose.    Use the mometasone (Nasonex) once daily - two sprays each nostril  Use the azelastine (Optivar) drops twice daily in each eye  You can continue all of these of medicine through allergy season to help relieve symptoms.  The Nasonex in particular works best with consistent daily use to prevent allergy symptoms.  Getting better control of the allergies will help prevent sinus infections in the future  Take 40mg  of the prednisone in the morning for the next 3 days - this will help calm down the acute inflammation and allergic symptoms  If any symptoms are worsening or not improving, please let us know   Allergic Rhinitis Allergic rhinitis is when the mucous membranes in the nose respond to allergens. Allergens are particles in the air that cause your body to have an allergic reaction. This causes you to release allergic antibodies. Through a chain of events, these eventually cause you to release histamine into the blood stream. Although meant to protect the body, it is this release of histamine that causes your discomfort, such as frequent sneezing, congestion, and an itchy, runny nose.  CAUSES  Seasonal allergic rhinitis (hay fever) is caused by pollen allergens that may come from grasses, trees, and weeds. Year-round allergic rhinitis (perennial allergic rhinitis) is caused by allergens such as house dust mites, pet dander, and mold spores.  SYMPTOMS   Nasal stuffiness (congestion).  Itchy, runny nose with sneezing and tearing of the eyes. DIAGNOSIS  Your health care provider can help you determine the allergen or allergens that trigger your symptoms. If you and your health care provider are unable to determine the allergen, skin or blood testing may be used. TREATMENT  Allergic Rhinitis does not have a cure, but it can be controlled by:  Medicines and allergy shots (immunotherapy).  Avoiding the  allergen. Hay fever may often be treated with antihistamines in pill or nasal spray forms. Antihistamines block the effects of histamine. There are over-the-counter medicines that may help with nasal congestion and swelling around the eyes. Check with your health care provider before taking or giving this medicine.  If avoiding the allergen or the medicine prescribed do not work, there are many new medicines your health care provider can prescribe. Stronger medicine may be used if initial measures are ineffective. Desensitizing injections can be used if medicine and avoidance does not work. Desensitization is when a patient is given ongoing shots until the body becomes less sensitive to the allergen. Make sure you follow up with your health care provider if problems continue. HOME CARE INSTRUCTIONS It is not possible to completely avoid allergens, but you can reduce your symptoms by taking steps to limit your exposure to them. It helps to know exactly what you are allergic to so that you can avoid your specific triggers. SEEK MEDICAL CARE IF:   You have a fever.  You develop a cough that does not stop easily (persistent).  You have shortness of breath.  You start wheezing.  Symptoms interfere with normal daily activities. Document Released: 09/11/2001 Document Revised: 10/07/2013 Document Reviewed: 08/24/2013 [facility] Patient Information 2014 [place], [place].

## 2014-03-17 NOTE — Progress Notes (Signed)
   Subjective:    Patient ID: Ventura Sellersisha M Even, female    DOB: 10/31/1981, 33 y.o.   MRN: [id number]  HPI    Ms. Rolley SimsHampton is a very pleasant 33 yr old female here with concern for illness.  Reports she began having allergy symptoms about 4 days ago.  Symptoms include runny/stuffy nose, itchy/watery eyes, post-nasal drainage, dry cough.  About 2 days ago, she developed increasing sinus pressure (L>R).  She has a history of sinus infections. Thinks symptoms may be attributable to allergies, but wants to rule out infection.  She denies fever, chills.  Took one dose of Zyrtec with minimal relief.  She has never taken regular allergy medication.  Tried Floanse a long time ago and did not tolerate   Review of Systems  Constitutional: Negative for fever and chills.  HENT: Positive for ear pain (fullness), postnasal drip, rhinorrhea and sneezing. Negative for sore throat.   Eyes: Positive for discharge (watery) and itching.  Respiratory: Positive for cough. Negative for shortness of breath and wheezing.   Cardiovascular: Negative.   Gastrointestinal: Negative.   Musculoskeletal: Negative.   Skin: Negative.        Objective:   Physical Exam  Vitals reviewed. Constitutional: She is oriented to person, place, and time. She appears well-developed and well-nourished. No distress.  HENT:  Head: Normocephalic and atraumatic.  Right Ear: Ear canal normal. A middle ear effusion is present.  Left Ear: Ear canal normal. A middle ear effusion is present.  Nose: Mucosal edema and rhinorrhea present. Right sinus exhibits maxillary sinus tenderness. Left sinus exhibits maxillary sinus tenderness and frontal sinus tenderness.  Mouth/Throat: Uvula is midline, oropharynx is clear and moist and mucous membranes are normal.  Eyes: Right conjunctiva is injected. Left conjunctiva is injected. No scleral icterus.  Neck: Neck supple.  Cardiovascular: Normal rate, regular rhythm and normal heart sounds.     Pulmonary/Chest: Effort normal. She has no wheezes. She has no rales.  Lymphadenopathy:    She has no cervical adenopathy.  Neurological: She is alert and oriented to person, place, and time.  Skin: Skin is warm and dry.  Psychiatric: She has a normal mood and affect. Her behavior is normal.        Assessment & Plan:  Allergic conjunctivitis and rhinitis - Plan: mometasone (NASONEX) 50 MCG/ACT nasal spray, prednisoLONE (PRELONE) 15 MG/5ML SOLN, azelastine (OPTIVAR) 0.05 % ophthalmic solution   Ms. Rolley SimsHampton is a very pleasant 33 yr old female here with 4 days of allergic rhinitis and conjunctivitis.  She does have sinus tenderness, but at this point, doubt acute bacterial sinusitis.  Discussed with pt that uncontrolled allergies may precipitate sinus infections.  Will do short burst of prednisone 40mg  daily x 3 days (pt requests liquid medication due to difficulty swallowing pills.)  Start zyrtec daily (make sure getting a 10mg  dose from the liquid.)  Nasonex daily - hopefully will be able to tolerate this better than flonase.  Discussed with pt that consistent daily use of medications will provide best relief of symptoms.  May continue through allergy season.  Optivar drops for allergic conjunctivitis.  Pt to call or RTC if worsening or not improving  E. Frances FurbishElizabeth Kelci Petrella MHS, PA-C [facility] 3/18/20156:36 PM

## 2015-08-25 ENCOUNTER — Ambulatory Visit (INDEPENDENT_AMBULATORY_CARE_PROVIDER_SITE_OTHER): Payer: BC Managed Care – PPO | Admitting: Family

## 2015-08-25 ENCOUNTER — Other Ambulatory Visit (INDEPENDENT_AMBULATORY_CARE_PROVIDER_SITE_OTHER): Payer: BC Managed Care – PPO

## 2015-08-25 ENCOUNTER — Encounter: Payer: Self-pay | Admitting: Family

## 2015-08-25 VITALS — BP 112/86 | HR 64 | Temp 98.3°F | Resp 18 | Ht 62.5 in | Wt 140.0 lb

## 2015-08-25 DIAGNOSIS — R5383 Other fatigue: Secondary | ICD-10-CM

## 2015-08-25 DIAGNOSIS — B354 Tinea corporis: Secondary | ICD-10-CM

## 2015-08-25 LAB — FERRITIN: FERRITIN: 33 ng/mL (ref 10.0–291.0)

## 2015-08-25 LAB — CBC
HCT: 36.5 % (ref 36.0–46.0)
Hemoglobin: 12.3 g/dL (ref 12.0–15.0)
MCHC: 33.7 g/dL (ref 30.0–36.0)
MCV: 91.8 fl (ref 78.0–100.0)
Platelets: 209 K/uL (ref 150.0–400.0)
RBC: 3.98 Mil/uL (ref 3.87–5.11)
RDW: 12.8 % (ref 11.5–15.5)
WBC: 7.8 K/uL (ref 4.0–10.5)

## 2015-08-25 LAB — IBC PANEL
Iron: 49 ug/dL (ref 42–145)
Saturation Ratios: 12.2 % — ABNORMAL LOW (ref 20.0–50.0)
TRANSFERRIN: 286 mg/dL (ref 212.0–360.0)

## 2015-08-25 LAB — TSH: TSH: 1.12 u[IU]/mL (ref 0.35–4.50)

## 2015-08-25 LAB — HEMOGLOBIN A1C: HEMOGLOBIN A1C: 5.4 % (ref 4.6–6.5)

## 2015-08-25 MED ORDER — CLOTRIMAZOLE-BETAMETHASONE 1-0.05 % EX CREA: 1.0000 "application " | TOPICAL_CREAM | Freq: Two times a day (BID) | CUTANEOUS | Status: DC

## 2015-08-25 NOTE — Assessment & Plan Note (Signed)
Fatigue of undetermined origin. Obtain CBC, ferritin, hemoglobin A1c, iron panel, and TSH to rule out metabolic causes. Cannot rule out underlying anxiety, depression, or cardiovascular disease. Follow-up pending lab work results.

## 2015-08-25 NOTE — Patient Instructions (Signed)
Thank you for choosing [facility].  Summary/Instructions:  Your prescription(s) have been submitted to your pharmacy or been printed and provided for you. Please take as directed and contact our office if you believe you are having problem(s) with the medication(s) or have any questions.  Please stop by the lab on the basement level of the building for your blood work. Your results will be released to [place] (or called to you) after review, usually within 72 hours after test completion. If any changes need to be made, you will be notified at that same time.  If your symptoms worsen or fail to improve, please contact our office for further instruction, or in case of emergency go directly to the emergency room at the closest medical facility.   Fatigue Fatigue is a feeling of tiredness, lack of energy, lack of motivation, or feeling tired all the time. Having enough rest, good nutrition, and reducing stress will normally reduce fatigue. Consult your caregiver if it persists. The nature of your fatigue will help your caregiver to find out its cause. The treatment is based on the cause.  CAUSES  There are many causes for fatigue. Most of the time, fatigue can be traced to one or more of your habits or routines. Most causes fit into one or more of three general areas. They are: Lifestyle problems  Sleep disturbances.  Overwork.  Physical exertion.  Unhealthy habits.  Poor eating habits or eating disorders.  Alcohol and/or drug use .  Lack of proper nutrition (malnutrition). Psychological problems  Stress and/or anxiety problems.  Depression.  Grief.  Boredom. Medical Problems or Conditions  Anemia.  Pregnancy.  Thyroid gland problems.  Recovery from major surgery.  Continuous pain.  Emphysema or asthma that is not well controlled  Allergic conditions.  Diabetes.  Infections (such as mononucleosis).  Obesity.  Sleep disorders, such as sleep  apnea.  Heart failure or other heart-related problems.  Cancer.  Kidney disease.  Liver disease.  Effects of certain medicines such as antihistamines, cough and cold remedies, prescription pain medicines, heart and blood pressure medicines, drugs used for treatment of cancer, and some antidepressants. SYMPTOMS  The symptoms of fatigue include:   Lack of energy.  Lack of drive (motivation).  Drowsiness.  Feeling of indifference to the surroundings. DIAGNOSIS  The details of how you feel help guide your caregiver in finding out what is causing the fatigue. You will be asked about your present and past health condition. It is important to review all medicines that you take, including prescription and non-prescription items. A thorough exam will be done. You will be questioned about your feelings, habits, and normal lifestyle. Your caregiver may suggest blood tests, urine tests, or other tests to look for common medical causes of fatigue.  TREATMENT  Fatigue is treated by correcting the underlying cause. For example, if you have continuous pain or depression, treating these causes will improve how you feel. Similarly, adjusting the dose of certain medicines will help in reducing fatigue.  HOME CARE INSTRUCTIONS   Try to get the required amount of good sleep every night.  Eat a healthy and nutritious diet, and drink enough water throughout the day.  Practice ways of relaxing (including yoga or meditation).  Exercise regularly.  Make plans to change situations that cause stress. Act on those plans so that stresses decrease over time. Keep your work and personal routine reasonable.  Avoid street drugs and minimize use of alcohol.  Start taking a daily multivitamin after  consulting your caregiver. SEEK MEDICAL CARE IF:   You have persistent tiredness, which cannot be accounted for.  You have fever.  You have unintentional weight loss.  You have headaches.  You have disturbed  sleep throughout the night.  You are feeling sad.  You have constipation.  You have dry skin.  You have gained weight.  You are taking any new or different medicines that you suspect are causing fatigue.  You are unable to sleep at night.  You develop any unusual swelling of your legs or other parts of your body. SEEK IMMEDIATE MEDICAL CARE IF:   You are feeling confused.  Your vision is blurred.  You feel faint or pass out.  You develop severe headache.  You develop severe abdominal, pelvic, or back pain.  You develop chest pain, shortness of breath, or an irregular or fast heartbeat.  You are unable to pass a normal amount of urine.  You develop abnormal bleeding such as bleeding from the rectum or you vomit blood.  You have thoughts about harming yourself or committing suicide.  You are worried that you might harm someone else. MAKE SURE YOU:   Understand these instructions.  Will watch your condition.  Will get help right away if you are not doing well or get worse. Document Released: 10/14/2007 Document Revised: 03/10/2012 Document Reviewed: 04/20/2014 [facility] Patient Information 2015 [place], [place]. This information is not intended to replace advice given to you by your health care provider. Make sure you discuss any questions you have with your health care provider.

## 2015-08-25 NOTE — Progress Notes (Signed)
Subjective:    Patient ID: [name], female    DOB: 11-22-1981, 34 y.o.   MRN: [id number]  Chief Complaint  Patient presents with  . Establish Care    noticing on her legs red marks are appearing, it is happening on both legs, wants to make sure she is not anemic    HPI:  [name] is a 34 y.o. female with a PMH of anemia, seasonal allergies, and multiple C-sections who presents today for an office visit to establish care.    1.) Spots on legs - Associated symptom of red spots located on her lower legs have been going on for about 4 weeks. Notes that every week it appears that one pops up. Described as non-itchy, non-painful. Denies any changes in shape or size to the original ones that appeared. Denies any modifying treatments or aggravating factors. Does note that she has felt her toes tingling and not sure if that was related.   2.) Fatigue - associated symptom of fatigue has been going on for several weeks of undetermined origin. Notes that she has a previous history of anemia. Describes feeling tired and lack of energy. Denies shortness of breath with exertion. Denies cold intolerance, or changes to hair skin or nails.  Allergies  Allergen Reactions  . Shellfish Allergy     hives     Outpatient Prescriptions Prior to Visit  Medication Sig Dispense Refill  . azelastine (OPTIVAR) 0.05 % ophthalmic solution Place 1 drop into both eyes 2 (two) times daily. 6 mL 12  . medroxyPROGESTERone (DEPO-PROVERA) 150 MG/ML injection Inject 150 mg into the muscle every 3 (three) months.    . mometasone (NASONEX) 50 MCG/ACT nasal spray Place 4 sprays into the nose daily. (2 sprays each nostril) 17 g 12  . Multiple Vitamin (MULTIVITAMIN) tablet Take 1 tablet by mouth daily.    . prednisoLONE (PRELONE) 15 MG/5ML SOLN Take 13.3 mLs (40 mg total) by mouth daily before breakfast. 40 mL 0   No facility-administered medications prior to visit.     Past Medical History  Diagnosis  Date  . No pertinent past medical history   . Normal pregnancy, incidental 05/01/2012  . Chicken pox   . Allergy   . Anemia      Past Surgical History  Procedure Laterality Date  . Cesarean section    . Dental surgery    . Cesarean section  06/18/2012    Procedure: CESAREAN SECTION;  Surgeon: Kirkland Hun, MD;  Location: [facility];  Service: Gynecology;  Laterality: N/A;  with delivery of baby boy at 75     Family History  Problem Relation Age of Onset  . Cancer Maternal Grandfather   . Prostate cancer Maternal Grandfather   . Hypertension Mother   . Arthritis Maternal Grandmother   . Hypertension Maternal Grandmother   . Diabetes Maternal Grandmother      Social History   Social History  . Marital Status: Single    Spouse Name: N/A  . Number of Children: 2  . Years of Education: 16   Occupational History  . Teacher     Middle School   Social History Main Topics  . Smoking status: Never Smoker   . Smokeless tobacco: Never Used  . Alcohol Use: Yes     Comment: rarely  . Drug Use: No  . Sexual Activity: Yes    Birth Control/ Protection: Pill     Comment: Depo    Other Topics Concern  .  Not on file   Social History Narrative   Fun: Family time   Denies religious beliefs effecting health care.   Denies abuse and feels safe at home.     Review of Systems  Constitutional: Negative for fever and chills.  Skin: Positive for rash.      Objective:    BP 112/86 mmHg  Pulse 64  Temp(Src) 98.3 F (36.8 C) (Oral)  Resp 18  Ht 5' 2.5" (1.588 m)  Wt 140 lb (63.504 kg)  BMI 25.18 kg/m2  SpO2 97% Nursing note and vital signs reviewed.  Physical Exam  Constitutional: She is oriented to person, place, and time. She appears well-developed and well-nourished. No distress.  Cardiovascular: Normal rate, regular rhythm, normal heart sounds and intact distal pulses.   Pulmonary/Chest: Effort normal and breath sounds normal.  Neurological: She is alert and  oriented to person, place, and time.  Skin: Skin is warm and dry.  1 cm irregularly shaped patch with red borders and nearly skin colored center and mild scaling located on the anterior aspect of her left tibia.  0.5 cm irregular shaped patch of errythemia noted that is blanchable and nontender located on her anterior right tibia around the tibial tuberosity.   Psychiatric: She has a normal mood and affect. Her behavior is normal. Judgment and thought content normal.       Assessment & Plan:   Problem List Items Addressed This Visit      Musculoskeletal and Integument   Tinea corporis    Exam consistent with tinea corporis. Start lotrisone. Follow up if symptoms worsen or fail to improve.       Relevant Medications   clotrimazole-betamethasone (LOTRISONE) cream     Other   Fatigue - Primary    Fatigue of undetermined origin. Obtain CBC, ferritin, hemoglobin A1c, iron panel, and TSH to rule out metabolic causes. Cannot rule out underlying anxiety, depression, or cardiovascular disease. Follow-up pending lab work results.      Relevant Orders   CBC   IBC panel   Hemoglobin A1c   TSH   Ferritin

## 2015-08-25 NOTE — Progress Notes (Signed)
Pre visit review using our clinic review tool, if applicable. No additional management support is needed unless otherwise documented below in the visit note. 

## 2015-08-25 NOTE — Assessment & Plan Note (Signed)
Exam consistent with tinea corporis. Start lotrisone. Follow up if symptoms worsen or fail to improve.

## 2015-08-29 ENCOUNTER — Encounter: Payer: Self-pay | Admitting: Family

## 2015-11-29 ENCOUNTER — Ambulatory Visit (INDEPENDENT_AMBULATORY_CARE_PROVIDER_SITE_OTHER): Payer: BC Managed Care – PPO | Admitting: Family Medicine

## 2015-11-29 ENCOUNTER — Other Ambulatory Visit: Payer: Self-pay | Admitting: Family

## 2015-11-29 VITALS — BP 100/68 | HR 82 | Temp 98.6°F | Resp 16 | Ht 63.0 in | Wt 139.0 lb

## 2015-11-29 DIAGNOSIS — J0100 Acute maxillary sinusitis, unspecified: Secondary | ICD-10-CM

## 2015-11-29 DIAGNOSIS — R0982 Postnasal drip: Secondary | ICD-10-CM

## 2015-11-29 DIAGNOSIS — J029 Acute pharyngitis, unspecified: Secondary | ICD-10-CM | POA: Diagnosis not present

## 2015-11-29 DIAGNOSIS — B354 Tinea corporis: Secondary | ICD-10-CM

## 2015-11-29 LAB — POCT RAPID STREP A (OFFICE): Rapid Strep A Screen: NEGATIVE

## 2015-11-29 MED ORDER — FLUCONAZOLE 150 MG PO TABS: 150.0000 mg | ORAL_TABLET | Freq: Every day | ORAL | Status: DC

## 2015-11-29 MED ORDER — MAGIC MOUTHWASH W/LIDOCAINE: 5.0000 mL | Freq: Four times a day (QID) | ORAL | Status: DC | PRN

## 2015-11-29 MED ORDER — AMOXICILLIN-POT CLAVULANATE 600-42.9 MG/5ML PO SUSR: 600.0000 mg | Freq: Two times a day (BID) | ORAL | Status: DC

## 2015-11-29 NOTE — Progress Notes (Signed)
 Chief Complaint:  Chief Complaint  Patient presents with  . Sore Throat    x 2 days    HPI: [name] is a 34 y.o. 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  Has sinus sxs, has allergies has PND.  Has had subjective fatigue, chills, no fevers. Has allergies.  Has tried otc meds without relief  Past Medical History  Diagnosis Date  . No pertinent past medical history   . Normal pregnancy, incidental 05/01/2012  . Chicken pox   . Allergy   . Anemia    Past Surgical History  Procedure Laterality Date  . Cesarean section    . Dental surgery    . Cesarean section  06/18/2012    Procedure: CESAREAN SECTION;  Surgeon: Kirkland Hun, MD;  Location: [facility];  Service: Gynecology;  Laterality: N/A;  with delivery of baby boy at 67   Social History   Social History  . Marital Status: Single    Spouse Name: N/A  . Number of Children: 2  . Years of Education: 16   Occupational History  . Teacher     Middle School   Social History Main Topics  . Smoking status: Never Smoker   . Smokeless tobacco: Never Used  . Alcohol Use: Yes     Comment: rarely  . Drug Use: No  . Sexual Activity: Yes    Birth Control/ Protection: Pill     Comment: Depo    Other Topics Concern  . None   Social History Narrative   Fun: Family time   Denies religious beliefs effecting health care.   Denies abuse and feels safe at home.    Family History  Problem Relation Age of Onset  . Cancer Maternal Grandfather   . Prostate cancer Maternal Grandfather   . Hypertension Mother   . Arthritis Maternal Grandmother   . Hypertension Maternal Grandmother   . Diabetes Maternal Grandmother    Allergies  Allergen Reactions  . Shellfish Allergy     hives   Prior to Admission medications   Medication Sig Start Date End Date Taking? Authorizing Provider  clotrimazole-betamethasone (LOTRISONE) cream Apply 1 application  topically 2 (two) times daily. 08/25/15  Yes [name], FNP  norgestimate-ethinyl estradiol (ORTHO-CYCLEN,SPRINTEC,PREVIFEM) 0.25-35 MG-MCG tablet Take 1 tablet by mouth daily.   Yes Historical Provider, MD     ROS: The patient denies fevers, chills, night sweats, unintentional weight loss, chest pain, palpitations, wheezing, dyspnea on exertion, nausea, vomiting, abdominal pain, dysuria, hematuria, melena, numbness, weakness, or tingling.   All other systems have been reviewed and were otherwise negative with the exception of those mentioned in the HPI and as above.    PHYSICAL EXAM: Filed Vitals:   11/29/15 1943  BP: 100/68  Pulse: 82  Temp: 98.6 F (37 C)  Resp: 16   Body mass index is 24.63 kg/(m^2).   General: Alert, no acute distress HEENT:  Normocephalic, atraumatic, oropharynx patent. EOMI, PERRLA Erythematous throat, no exudates, TM normal, + sinus tenderness, + erythematous/boggy nasal mucosa Cardiovascular:  Regular rate and rhythm, no rubs murmurs or gallops.  No Carotid bruits, radial pulse intact. No pedal edema.  Respiratory: Clear to auscultation bilaterally.  No wheezes, rales, or rhonchi.  No cyanosis, no use of accessory musculature Abdominal: No organomegaly, abdomen is soft and non-tender, positive bowel sounds. No masses. Skin: No rashes. Neurologic: Facial musculature symmetric. Psychiatric: Patient  acts appropriately throughout our interaction. Lymphatic: No cervical or submandibular lymphadenopathy Musculoskeletal: Gait intact. No edema, tenderness   LABS: Results for orders placed or performed in visit on 11/29/15  Culture, Group A Strep  Result Value Ref Range   Organism ID, Bacteria Normal Upper Respiratory Flora    Organism ID, Bacteria No Beta Hemolytic Streptococci Isolated   POCT rapid strep A  Result Value Ref Range   Rapid Strep A Screen Negative Negative     EKG/XRAY:   Primary [facility]   ASSESSMENT/PLAN: Encounter Diagnoses  Name Primary?  . Acute pharyngitis, unspecified pharyngitis type   . Acute maxillary sinusitis, recurrence not specified Yes  . PND (post-nasal drip)    Rx Amoax Rx magic mouthwash with lidocaine Rx diflucan Throat cx pending   Gross sideeffects, risk and benefits, and alternatives of medications d/w patient. Patient is aware that all medications have potential sideeffects and we are unable to predict every sideeffect or drug-drug interaction that may occur.    DO  12/04/2015 5:15 AM

## 2015-11-29 NOTE — Patient Instructions (Signed)

## 2015-11-30 ENCOUNTER — Telehealth: Payer: Self-pay

## 2015-11-30 MED ORDER — CLOTRIMAZOLE-BETAMETHASONE 1-0.05 % EX CREA: 1.0000 "application " | TOPICAL_CREAM | Freq: Two times a day (BID) | CUTANEOUS | Status: DC

## 2015-11-30 NOTE — Addendum Note (Signed)
Addended by: Jeanine LuzALONE, Neysa Arts D on: 11/30/2015 09:55 PM   Modules accepted: Orders

## 2015-11-30 NOTE — Telephone Encounter (Signed)
Advised note ready to pick up. 

## 2015-11-30 NOTE — Telephone Encounter (Signed)
Pt would like to know if Dr Conley RollsLe would extend her oow note for another day and have her going back to work on Friday, had written one to go back to work tomorrow, but wasn't able to start On the medicine until today. Please call [phone] and she will pick up

## 2015-12-01 LAB — CULTURE, GROUP A STREP: Organism ID, Bacteria: NORMAL

## 2016-02-24 ENCOUNTER — Ambulatory Visit (INDEPENDENT_AMBULATORY_CARE_PROVIDER_SITE_OTHER): Payer: BC Managed Care – PPO | Admitting: Allergy and Immunology

## 2016-02-24 ENCOUNTER — Encounter: Payer: Self-pay | Admitting: Allergy and Immunology

## 2016-02-24 VITALS — BP 120/80 | HR 80 | Temp 98.6°F | Resp 18 | Ht 62.99 in | Wt 144.8 lb

## 2016-02-24 DIAGNOSIS — J309 Allergic rhinitis, unspecified: Secondary | ICD-10-CM | POA: Diagnosis not present

## 2016-02-24 DIAGNOSIS — L209 Atopic dermatitis, unspecified: Secondary | ICD-10-CM

## 2016-02-24 DIAGNOSIS — Z91013 Allergy to seafood: Secondary | ICD-10-CM

## 2016-02-24 DIAGNOSIS — H1010 Acute atopic conjunctivitis, unspecified eye: Secondary | ICD-10-CM | POA: Diagnosis not present

## 2016-02-24 DIAGNOSIS — R05 Cough: Secondary | ICD-10-CM

## 2016-02-24 DIAGNOSIS — R059 Cough, unspecified: Secondary | ICD-10-CM

## 2016-02-24 MED ORDER — EPINEPHRINE 0.3 MG/0.3ML IJ SOAJ: INTRAMUSCULAR | Status: DC

## 2016-02-24 MED ORDER — MONTELUKAST SODIUM 10 MG PO TABS: ORAL_TABLET | ORAL | Status: DC

## 2016-02-24 MED ORDER — FLUTICASONE PROPIONATE 50 MCG/ACT NA SUSP: NASAL | Status: DC

## 2016-02-24 MED ORDER — CRISABOROLE 2 % EX OINT: 1.0000 "application " | TOPICAL_OINTMENT | Freq: Every day | CUTANEOUS | Status: DC

## 2016-02-24 MED ORDER — OLOPATADINE HCL 0.7 % OP SOLN: 1.0000 [drp] | Freq: Every day | OPHTHALMIC | Status: DC

## 2016-02-24 NOTE — Progress Notes (Signed)
NEW PATIENT NOTE  RE: [name] MRN: [id number] DOB: 1981/09/08 [facility] [place] [address] [place] [address] Date of Office Visit: 02/24/2016  Dear [name], FNP:  I had the pleasure of seeing [name] today in initial evaluation as you recall-- Subjective:  [name] is a 35 y.o. female who presents today for Urticaria; Nasal Congestion; Headache; and Allergy Testing  Assessment:   1. Allergic rhinoconjunctivitis, seasonal and perennial hypersensitivities   2. Shellfish allergy.  (2007/2008)   3. Cough, intermittent with normal lung exam and excellent in office spirometry.    4. Atopic dermatitis, mild since childhood.    5.      Oral pollinosis syndrome. 6.      Suspected lactose intolerance. Plan:   Meds ordered this encounter  Medications  . fluticasone (FLONASE) 50 MCG/ACT nasal spray    Sig: Use 1-2 sprays in each nostril once daily for stuffy nose or drainage.    Dispense:  17 g    Refill:  5  . Crisaborole (EUCRISA) 2 % OINT    Sig: Apply 1 application topically daily. **to leg**    Dispense:  60 g    Refill:  5    **Patient has a voucher to get first 60g tube for zero copay**  . Olopatadine HCl (PAZEO) 0.7 % SOLN    Sig: Apply 1 drop to eye daily. as needed for itchy eyes.    Dispense:  1 Bottle    Refill:  5  . montelukast (SINGULAIR) 10 MG tablet    Sig: Take one tablet each evening to prevent cough or wheeze.    Dispense:  30 tablet    Refill:  5  . EPINEPHrine (EPIPEN 2-PAK) 0.3 mg/0.3 mL IJ SOAJ injection    Sig: Use as directed for a severe allergic reaction.    Dispense:  4 Device    Refill:  2    **Please dispense MYLAN generic**   Patient Instructions  1. Avoidance: Mite, Mold and Pollen  and carrots, shellfish, watermelon and cantaloupe. 2. Antihistamine: Zyrtec 10 mg by mouth once daily for runny nose or itching. 3. Nasal Spray: Flonase 1-2 sprays spray(s) each nostril once daily for  stuffy nose or drainage.  4. Begin Singulair 10 mg once daily. 5. Eucrisa ointment 2% to leg rash once daily as needed. 6. Eye Drops: Pazeo one drop(s) each eye once daily for itchy eyes as needed. 7. Other: EpiPen/Benadryl as needed.       Information on allergy injections.                  FARE information and emergency action plan.       Oral pollinosis Information. 8. Nasal Saline wash evening at shower time and as needed especially after outdoor time.  9. Follow up Visit: 2 months or sooner if needed and consider selected laboratory testing at [facility].  HPI: [name] presents to the office with a 20 year history of rhinorrhea, congestion, sneezing, likely provoked by pollen, dust, dog, outdoor and fluctuant weather pattern exposures.  She finds her symptoms are year-round and does recall albuterol use in her childhood, but had not use as an adult.  Recently she notices cough or chest congestion (without wheezing, chest tightness or shortness of breath), though not daily, nocturnally or associated with any exercise.  She does not recall recent systemic steroids, ED visits or hospitalizations, nor any snoring.   In addition, there has been  pruritic rash areas on varying occasions which has prompted concern for food allergy.  Particular at age 58 with ingestion of shrimp, she noted hives, throat irritation and tightening , therefore has avoided since.  Spicy, greasy or dairy-related foods tend to cause loose stools, but no frequent difficulty.  She recalls throat or oral itching with raw carrots, watermelon, cantaloupe, banana and reflux with specific foods.  She thought reflux was greater after some breakfast meals but not consistent.  She denies swelling of her lips, tongue, throat, shortness of breath or dysphagia.  She reports a history of eczema since childhood and in the last week notice occasional pruritic erythematous raised areas greatest at legs.  She has found benefit with antihistamine and  nasal sprays.  Denies Urgent care visits or recurring antibiotic courses.  Medical History: Past Medical History  Diagnosis Date  . No pertinent past medical history   . Normal pregnancy, incidental 05/01/2012  . Chicken pox   . Allergy   . Anemia    Surgical History: Past Surgical History  Procedure Laterality Date  . Cesarean section    . Dental surgery    . Cesarean section  06/18/2012    Procedure: CESAREAN SECTION;  Surgeon: Kirkland Hun, MD;  Location: [facility];  Service: Gynecology;  Laterality: N/A;  with delivery of baby boy at 32   Family History: Family History  Problem Relation Age of Onset  . Cancer Maternal Grandfather   . Prostate cancer Maternal Grandfather   . Hypertension Mother   . Allergic rhinitis Mother   . Arthritis Maternal Grandmother   . Hypertension Maternal Grandmother   . Diabetes Maternal Grandmother   . Allergic rhinitis Father   . Allergic rhinitis Sister   . Food Allergy Sister   . Angioedema Neg Hx   . Asthma Neg Hx   . Atopy Neg Hx   . Eczema Neg Hx   . Immunodeficiency Neg Hx   . Urticaria Neg Hx   . Food Allergy Son    Social History: Social History   Social History  . Marital Status:  married     Spouse Name: N/A  . Number of Children: 2  . Years of Education: 16   Occupational History  . Teacher     Middle School   Social History Main Topics  . Smoking status: Never Smoker   . Smokeless tobacco: Never Used  . Alcohol Use: Yes     Comment: rarely  . Drug Use: No  . 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      [name] has a current medication list which includes the following prescription(s): cetirizine, clotrimazole-betamethasone, norgestimate-ethinyl estradiol and fluticasone.   Drug Allergies: Allergies  Allergen Reactions  . Shellfish Allergy     hives    Environmental History:  [name]  lives in a 35 year old house for 5 years with carbonate/tile floors, with central heat and air; stuffed mattress, non-feather comforter, down and cotton pillows with a poodle and fish without humidifier, or smokers.  Review of Systems  Constitutional: Negative for fever, weight loss and malaise/fatigue.  HENT: Positive for congestion. Negative for ear pain, hearing loss, nosebleeds and sore throat.   Eyes: Negative for discharge and redness.  Respiratory: Negative for hemoptysis, sputum production and shortness of breath.  Single episode of pneumonia at age 22 years. Denies history of bronchitis.  Cardiovascular: Negative for chest pain.  Gastrointestinal: Positive for heartburn (Food specific.). Negative for nausea, vomiting, abdominal pain, diarrhea and constipation.  Genitourinary: Negative.   Musculoskeletal: Negative for myalgias and joint pain.  Skin: Positive for itching and rash.  Neurological: Negative.  Negative for dizziness, seizures, weakness and headaches.  Endo/Heme/Allergies: Positive for environmental allergies.       Denies sensitivity to aspirin, NSAIDs, stinging insects, latex, jewelry and cosmetics.  Immunological: No chronic or recurring infections. Objective:   Filed Vitals:   02/24/16 1125  BP: 120/80  Pulse: 80  Temp: 98.6 F (37 C)  Resp: 18   Physical Exam  Constitutional: She is well-developed, well-nourished, and in no distress.  HENT:  Head: Atraumatic.  Right Ear: Tympanic membrane and ear canal normal.  Left Ear: Tympanic membrane and ear canal normal.  Nose: Mucosal edema present. No rhinorrhea. No epistaxis.  Mouth/Throat: Oropharynx is clear and moist and mucous membranes are normal. No oropharyngeal exudate, posterior oropharyngeal edema or posterior oropharyngeal erythema.  Eyes: Conjunctivae are normal.  Neck: Neck supple.  Cardiovascular: Normal rate, S1 normal and S2 normal.   No murmur  heard. Pulmonary/Chest: Effort normal. She has no wheezes. She has no rhonchi. She has no rales.  Abdominal: Soft. Normal appearance and bowel sounds are normal.  Musculoskeletal: She exhibits no edema.  Lymphadenopathy:    She has no cervical adenopathy.  Neurological: She is alert.  Skin: Skin is warm and intact. Rash (Mild dry scaling area at left anterior lower extremity.) noted. No cyanosis. Nails show no clubbing.  No dermatographia.   Diagnostics: Spirometry:  FVC 2.31--85%, FEV1 2.29--97%.  Skin testing:  Very strong reactivity to multiple grass, tree and ragweed pollens, dust mite and crab/shellfish; strong reactivity to multiple mold species, cockroach, dog epithelial, multiple weed pollens, sesame seed and shrimp; mild reactivity to watermelon, cantaloupe, carrots, and corn.    Carla Vanderpool M. Willa Rough, MD   cc: Jeanine Luz, FNP

## 2016-02-24 NOTE — Patient Instructions (Addendum)
Take Home Sheet  1. Avoidance: Mite, Mold and Pollen  and carrots, shellfish, watermelon and cantaloupe.  2. Antihistamine: Zyrtec by mouth once daily for runny nose or itching.  3. Nasal Spray: Flonase 1-2 sprays spray(s) each nostril once daily for stuffy nose or drainage.   4.  Singulair 10 mg once daily.  5. Eucrisa ointment 2% to leg rash once daily as needed.  6. Eye Drops: Pazeo one drop(s) each eye once daily for itchy eyes.  7. Other: EpiPen/Benadryl as needed.       Information on allergy injections.                  FARE information and emergency action plan.       Oral pollinosis Information.  8. Nasal Saline wash evening at shower time and as needed especially after outdoor time.    9. Follow up Visit: 2 months or sooner if needed.   Websites that have reliable Patient information: 1. American Academy of Asthma, Allergy, & Immunology: www.aaaai.org 2. Food Allergy Network: www.foodallergy.org 3. Mothers of Asthmatics: www.aanma.org 4. [facility]: https://www.strong.com/ 5. American College of Allergy, Asthma, & Immunology: BiggerRewards.is or www.acaai.org  Control of House Dust Mite Allergen  House dust mites play a major role in allergic asthma and rhinitis.  They occur in environments with high humidity wherever human skin, the food for dust mites is found. High levels have been detected in dust obtained from mattresses, pillows, carpets, upholstered furniture, bed covers, clothes and soft toys.  The principal allergen of the house dust mite is found in its feces.  A gram of dust may contain 1,000 mites and 250,000 fecal particles.  Mite antigen is easily measured in the air during house cleaning activities.  1. Encase mattresses, including the box spring, and pillow, in an air tight cover.  Seal the zipper end of the encased mattresses with wide adhesive tape. 2. Wash the bedding in water of 130 degrees Farenheit weekly.  Avoid cotton  comforters/quilts and flannel bedding: the most ideal bed covering is the dacron comforter. 3. Remove all upholstered furniture from the bedroom. 4. Remove carpets, carpet padding, rugs, and non-washable window drapes from the bedroom.  Wash drapes weekly or use plastic window coverings. 5. Remove all non-washable stuffed toys from the bedroom.  Wash stuffed toys weekly. 6. Have the room cleaned frequently with a vacuum cleaner and a damp dust-mop.  The patient should not be in a room which is being cleaned and should wait 1 hour after cleaning before going into the room. 7. Close and seal all heating outlets in the bedroom.  Otherwise, the room will become filled with dust-laden air.  An electric heater can be used to heat the room. 8. Reduce indoor humidity to less than 50%.  Do not use a humidifier.  Reducing Pollen Exposure  The American Academy of Allergy, Asthma and Immunology suggests the following steps to reduce your exposure to pollen during allergy seasons.  9. Do not hang sheets or clothing out to dry; pollen may collect on these items. 10. Do not mow lawns or spend time around freshly cut grass; mowing stirs up pollen. 11. Keep windows closed at night.  Keep car windows closed while driving. 12. Minimize morning activities outdoors, a time when pollen counts are usually at their highest. 13. Stay indoors as much as possible when pollen counts or humidity is high and on windy days when pollen tends to remain in the air  longer. 14. Use air conditioning when possible.  Many air conditioners have filters that trap the pollen spores. 15. Use a HEPA room air filter to remove pollen form the indoor air you breathe.  Control of Mold Allergen  Mold and fungi can grow on a variety of surfaces provided certain temperature and moisture conditions exist.  Outdoor molds grow on plants, decaying vegetation and soil.  The major outdoor mold, Alternaria dn Cladosporium, are found in very high  numbers during hot and dry conditions.  Generally, a late Summer - Fall peak is seen for common outdoor fungal spores.  Rain will temporarily lower outdoor mold spore count, but counts rise rapidly when the rainy period ends.  The most important indoor molds are Aspergillus and Penicillium.  Dark, humid and poorly ventilated basements are ideal sites for mold growth.  The next most common sites of mold growth are the bathroom and the kitchen.  Outdoor [employer] 1. Use air conditioning and keep windows closed 2. Avoid exposure to decaying vegetation. 3. Avoid leaf raking. 4. Avoid grain handling. 5. Consider wearing a face mask if working in moldy areas.  Indoor Mold Control 1. Maintain humidity below 50%. 2. Clean washable surfaces with 5% bleach solution. 3. Remove sources e.g. Contaminated carpets.  Control of Cockroach Allergen  Cockroach allergen has been identified as an important cause of acute attacks of asthma, especially in urban settings.  There are fifty-five species of cockroach that exist in the [place], however only three, the [place], [place] species produce allergen that can affect patients with Asthma.  Allergens can be obtained from fecal particles, egg casings and secretions from cockroaches.  1. Remove food sources. 2. Reduce access to water. 3. Seal access and entry points. 4. Spray runways with 0.5-1% Diazinon or Chlorpyrifos 5. Blow boric acid power under stoves and refrigerator. 6. Place bait stations (hydramethylnon) at feeding sites.

## 2016-02-27 ENCOUNTER — Encounter: Payer: Self-pay | Admitting: Allergy and Immunology

## 2016-03-01 ENCOUNTER — Other Ambulatory Visit: Payer: Self-pay | Admitting: Gastroenterology

## 2016-03-01 DIAGNOSIS — R110 Nausea: Secondary | ICD-10-CM

## 2016-03-22 ENCOUNTER — Encounter (HOSPITAL_COMMUNITY)
Admission: RE | Admit: 2016-03-22 | Discharge: 2016-03-22 | Disposition: A | Discharge status: Home / Self Care | Payer: BC Managed Care – PPO | Source: Ambulatory Visit | Attending: Gastroenterology | Admitting: Gastroenterology

## 2016-03-22 ENCOUNTER — Ambulatory Visit (HOSPITAL_COMMUNITY): Payer: BC Managed Care – PPO

## 2016-03-22 DIAGNOSIS — R110 Nausea: Secondary | ICD-10-CM | POA: Diagnosis present

## 2016-03-22 MED ORDER — TECHNETIUM TC 99M MEBROFENIN IV KIT
5.2800 | PACK | Freq: Once | INTRAVENOUS | Status: AC | PRN
  Administered 2016-03-22: 5 via INTRAVENOUS

## 2016-05-03 ENCOUNTER — Ambulatory Visit: Payer: BC Managed Care – PPO | Admitting: Allergy and Immunology

## 2016-05-11 ENCOUNTER — Ambulatory Visit (INDEPENDENT_AMBULATORY_CARE_PROVIDER_SITE_OTHER): Payer: BC Managed Care – PPO | Admitting: Physician Assistant

## 2016-05-11 DIAGNOSIS — H1089 Other conjunctivitis: Secondary | ICD-10-CM | POA: Diagnosis not present

## 2016-05-11 DIAGNOSIS — K449 Diaphragmatic hernia without obstruction or gangrene: Secondary | ICD-10-CM | POA: Insufficient documentation

## 2016-05-11 DIAGNOSIS — K219 Gastro-esophageal reflux disease without esophagitis: Secondary | ICD-10-CM | POA: Insufficient documentation

## 2016-05-11 DIAGNOSIS — A499 Bacterial infection, unspecified: Secondary | ICD-10-CM

## 2016-05-11 DIAGNOSIS — H109 Unspecified conjunctivitis: Secondary | ICD-10-CM

## 2016-05-11 MED ORDER — GENTAMICIN SULFATE 0.3 % OP SOLN: 2.0000 [drp] | Freq: Three times a day (TID) | OPHTHALMIC | Status: AC

## 2016-05-11 NOTE — Progress Notes (Signed)
   [name]  MRN: [id number] DOB: 06/01/1981  Subjective:  Pt presents to clinic with right eye redness and pain and green discharge for the last 3 days - she is worried because her son just had pink eye and she does not want to pass it to him.  She teaches middle school students.  She is having some difficulty with seeing but it is from mucus.  She wears glasses but does not have them with her today.  Patient Active Problem List   Diagnosis Date Noted  . GERD (gastroesophageal reflux disease) 05/11/2016  . Hiatal hernia 05/11/2016  . Fatigue 08/25/2015  . Tinea corporis 08/25/2015  . Anemia 05/01/2012    Current Outpatient Prescriptions on File Prior to Visit  Medication Sig Dispense Refill  . cetirizine (ZYRTEC) 10 MG tablet Take 10 mg by mouth daily.    Marland Kitchen. EPINEPHrine (EPIPEN 2-PAK) 0.3 mg/0.3 mL IJ SOAJ injection Use as directed for a severe allergic reaction. 4 Device 2  . fluticasone (FLONASE) 50 MCG/ACT nasal spray Use 1-2 sprays in each nostril once daily for stuffy nose or drainage. 17 g 5  . montelukast (SINGULAIR) 10 MG tablet Take one tablet each evening to prevent cough or wheeze. 30 tablet 5  . norgestimate-ethinyl estradiol (ORTHO-CYCLEN,SPRINTEC,PREVIFEM) 0.25-35 MG-MCG tablet Take 1 tablet by mouth daily.    . Olopatadine HCl (PAZEO) 0.7 % SOLN Apply 1 drop to eye daily. as needed for itchy eyes. 1 Bottle 5   No current facility-administered medications on file prior to visit.    Allergies  Allergen Reactions  . Shellfish Allergy     hives  . Watermelon [Citrullus Vulgaris] Hives    Review of Systems  HENT: Negative.   Eyes: Positive for pain, discharge (green - right eye) and visual disturbance. Negative for photophobia and itching.   Objective:  BP 122/74 mmHg  Pulse 88  Temp(Src) 98.4 F (36.9 C) (Oral)  Resp 16  Ht 5' 3.5" (1.613 m)  Wt 150 lb (68.04 kg)  BMI 26.15 kg/m2  SpO2 99%  LMP 04/17/2016  Physical Exam  Constitutional: She is  oriented to person, place, and time and well-developed, well-nourished, and in no distress.  HENT:  Head: Normocephalic and atraumatic.  Right Ear: Hearing and external ear normal.  Left Ear: Hearing and external ear normal.  Eyes: Right eye exhibits discharge (dried greenish discharge on lashes). Right conjunctiva is injected (mild).  Neck: Normal range of motion.  Pulmonary/Chest: Effort normal.  Neurological: She is alert and oriented to person, place, and time. Gait normal.  Skin: Skin is warm and dry.  Psychiatric: Mood, memory, affect and judgment normal.  Vitals reviewed.    Visual Acuity Screening   Right eye Left eye Both eyes  Without correction: 20/50 20/30 20/25   With correction:       Assessment and Plan :  Bacterial conjunctivitis of right eye - Plan: gentamicin (GARAMYCIN) 0.3 % ophthalmic solution d/w pt how to prevent passing infection - good handwashing  Benny LennertSarah Kellie Murrill PA-C  [facility] 05/11/2016 11:56 AM

## 2016-05-11 NOTE — Patient Instructions (Addendum)
IF you received an x-ray today, you will receive an invoice from [facility]. Please contact [facility] at [phone] with questions or concerns regarding your invoice.   IF you received labwork today, you will receive an invoice from [facility]. Please contact [facility] at [phone] with questions or concerns regarding your invoice.   Our billing staff will not be able to assist you with questions regarding bills from these companies.  You will be contacted with the lab results as soon as they are available. The fastest way to get your results is to activate your My Chart account. Instructions are located on the last page of this paperwork. If you have not heard from us regarding the results in 2 weeks, please contact this office.     bacteriBacterial Conjunctivitis Bacterial conjunctivitis, commonly called pink eye, is an inflammation of the clear membrane that covers the white part of the eye (conjunctiva). The inflammation can also happen on the underside of the eyelids. The blood vessels in the conjunctiva become inflamed, causing the eye to become red or pink. Bacterial conjunctivitis may spread easily from one eye to another and from person to person (contagious).  CAUSES  Bacterial conjunctivitis is caused by bacteria. The bacteria may come from your own skin, your upper respiratory tract, or from someone else with bacterial conjunctivitis. SYMPTOMS  The normally white color of the eye or the underside of the eyelid is usually pink or red. The pink eye is usually associated with irritation, tearing, and some sensitivity to light. Bacterial conjunctivitis is often associated with a thick, yellowish discharge from the eye. The discharge may turn into a crust on the eyelids overnight, which causes your eyelids to stick together. If a discharge is present, there may also be some blurred vision in the affected eye. DIAGNOSIS  Bacterial  conjunctivitis is diagnosed by your caregiver through an eye exam and the symptoms that you report. Your caregiver looks for changes in the surface tissues of your eyes, which may point to the specific type of conjunctivitis. A sample of any discharge may be collected on a cotton-tip swab if you have a severe case of conjunctivitis, if your cornea is affected, or if you keep getting repeat infections that do not respond to treatment. The sample will be sent to a lab to see if the inflammation is caused by a bacterial infection and to see if the infection will respond to antibiotic medicines. TREATMENT   Bacterial conjunctivitis is treated with antibiotics. Antibiotic eyedrops are most often used. However, antibiotic ointments are also available. Antibiotics pills are sometimes used. Artificial tears or eye washes may ease discomfort. HOME CARE INSTRUCTIONS   To ease discomfort, apply a cool, clean washcloth to your eye for 10-20 minutes, 3-4 times a day.  Gently wipe away any drainage from your eye with a warm, wet washcloth or a cotton ball.  Wash your hands often with soap and water. Use paper towels to dry your hands.  Do not share towels or washcloths. This may spread the infection.  Change or wash your pillowcase every day.  You should not use eye makeup until the infection is gone.  Do not operate machinery or drive if your vision is blurred.  Stop using contact lenses. Ask your caregiver how to sterilize or replace your contacts before using them again. This depends on the type of contact lenses that you use.  When applying medicine to the infected eye, do not touch the  edge of your eyelid with the eyedrop bottle or ointment tube. SEEK IMMEDIATE MEDICAL CARE IF:   Your infection has not improved within 3 days after beginning treatment.  You had yellow discharge from your eye and it returns.  You have increased eye pain.  Your eye redness is spreading.  Your vision becomes  blurred.  You have a fever or persistent symptoms for more than 2-3 days.  You have a fever and your symptoms suddenly get worse.  You have facial pain, redness, or swelling. MAKE SURE YOU:   Understand these instructions.  Will watch your condition.  Will get help right away if you are not doing well or get worse.   This information is not intended to replace advice given to you by your health care provider. Make sure you discuss any questions you have with your health care provider.   Document Released: 12/17/2005 Document Revised: 01/07/2015 Document Reviewed: 05/19/2012 Elsevier Interactive Patient Education [employer].

## 2017-01-01 ENCOUNTER — Encounter: Payer: Self-pay | Admitting: Certified Nurse Midwife

## 2017-01-01 ENCOUNTER — Encounter: Payer: Self-pay | Admitting: *Deleted

## 2017-01-01 ENCOUNTER — Ambulatory Visit (INDEPENDENT_AMBULATORY_CARE_PROVIDER_SITE_OTHER): Payer: BC Managed Care – PPO | Admitting: Certified Nurse Midwife

## 2017-01-01 VITALS — BP 114/77 | HR 96 | Wt 149.0 lb

## 2017-01-01 DIAGNOSIS — Z1151 Encounter for screening for human papillomavirus (HPV): Secondary | ICD-10-CM

## 2017-01-01 DIAGNOSIS — Z124 Encounter for screening for malignant neoplasm of cervix: Secondary | ICD-10-CM | POA: Diagnosis not present

## 2017-01-01 DIAGNOSIS — Z98891 History of uterine scar from previous surgery: Secondary | ICD-10-CM

## 2017-01-01 DIAGNOSIS — O0932 Supervision of pregnancy with insufficient antenatal care, second trimester: Secondary | ICD-10-CM

## 2017-01-01 DIAGNOSIS — O09529 Supervision of elderly multigravida, unspecified trimester: Secondary | ICD-10-CM

## 2017-01-01 DIAGNOSIS — Z3490 Encounter for supervision of normal pregnancy, unspecified, unspecified trimester: Secondary | ICD-10-CM

## 2017-01-01 DIAGNOSIS — O09522 Supervision of elderly multigravida, second trimester: Secondary | ICD-10-CM

## 2017-01-01 DIAGNOSIS — O0930 Supervision of pregnancy with insufficient antenatal care, unspecified trimester: Secondary | ICD-10-CM

## 2017-01-01 NOTE — Addendum Note (Signed)
Addended by: Dalphine HandingGARDNER, Angelea Penny L on: 01/01/2017 05:07 PM   Modules accepted: Orders

## 2017-01-01 NOTE — Progress Notes (Signed)
Baby Scripts offered but declined

## 2017-01-01 NOTE — Progress Notes (Signed)
Subjective:    [name] is being seen today for her first obstetrical visit.  This is a planned pregnancy. She is at 6583w2d gestation. Her obstetrical history is significant for previous C-section X2 for failure to progress, LTCS. Relationship with FOB: significant other, living together. Patient does intend to breast feed. Pregnancy history fully reviewed.  The information documented in the HPI was reviewed and verified.  Menstrual History: OB History    Gravida Para Term Preterm AB Living   4 2 2  0 1 2   SAB TAB Ectopic Multiple Live Births   1 0 0 0 2       Patient's last menstrual period was 09/16/2016 (exact date).    Past Medical History:  Diagnosis Date  . Allergy   . Anemia   . Chicken pox   . No pertinent past medical history   . Normal pregnancy, incidental 05/01/2012    Past Surgical History:  Procedure Laterality Date  . CESAREAN SECTION    . CESAREAN SECTION  06/18/2012   Procedure: CESAREAN SECTION;  Surgeon: Kirkland HunArthur Stringer, MD;  Location: [facility];  Service: Gynecology;  Laterality: N/A;  with delivery of baby boy at [id number]  . DENTAL SURGERY       (Not in a hospital admission) Allergies  Allergen Reactions  . Shellfish Allergy     hives  . Watermelon [Citrullus Vulgaris] Hives    Social History  Substance Use Topics  . Smoking status: Never Smoker  . Smokeless tobacco: Never Used  . Alcohol use No    Family History  Problem Relation Age of Onset  . Cancer Maternal Grandfather   . Prostate cancer Maternal Grandfather   . Hypertension Mother   . Allergic rhinitis Mother   . Arthritis Maternal Grandmother   . Hypertension Maternal Grandmother   . Diabetes Maternal Grandmother   . Allergic rhinitis Father   . Allergic rhinitis Sister   . Food Allergy Sister   . Food Allergy Son   . Angioedema Neg Hx   . Asthma Neg Hx   . Atopy Neg Hx   . Eczema Neg Hx   . Immunodeficiency Neg Hx   . Urticaria Neg Hx      Review of Systems Constitutional:  negative for weight loss Gastrointestinal: negative for vomiting Genitourinary:negative for genital lesions and vaginal discharge and dysuria Musculoskeletal:negative for back pain Behavioral/Psych: negative for abusive relationship, depression, illegal drug usage and tobacco use    Objective:    BP 114/77   Pulse 96   Wt 149 lb (67.6 kg)   LMP 09/16/2016 (Exact Date)   BMI 25.98 kg/m  General Appearance:    Alert, cooperative, no distress, appears stated age  Head:    Normocephalic, without obvious abnormality, atraumatic  Eyes:    PERRL, conjunctiva/corneas clear, EOM's intact, fundi    benign, both eyes  Ears:    Normal TM's and external ear canals, both ears  Nose:   Nares normal, septum midline, mucosa normal, no drainage    or sinus tenderness  Throat:   Lips, mucosa, and tongue normal; teeth and gums normal  Neck:   Supple, symmetrical, trachea midline, no adenopathy;    thyroid:  no enlargement/tenderness/nodules; no carotid   bruit or JVD  Back:     Symmetric, no curvature, ROM normal, no CVA tenderness  Lungs:     Clear to auscultation bilaterally, respirations unlabored  Chest Wall:    No tenderness or deformity   Heart:  Regular rate and rhythm, S1 and S2 normal, no murmur, rub   or gallop  Breast Exam:    No tenderness, masses, or nipple abnormality  Abdomen:     Soft, non-tender, bowel sounds active all four quadrants,    no masses, no organomegaly  Genitalia:    Normal female without lesion, discharge or tenderness  Extremities:   Extremities normal, atraumatic, no cyanosis or edema  Pulses:   2+ and symmetric all extremities  Skin:   Skin color, texture, turgor normal, no rashes or lesions  Lymph nodes:   Cervical, supraclavicular, and axillary nodes normal  Neurologic:   CNII-XII intact, normal strength, sensation and reflexes    throughout                       Cervix:  Long, thick, closed and posterior.  FHR: 155 by doppler.      Lab Review Urine  pregnancy test Labs reviewed yes Radiologic studies reviewed no Assessment:    Pregnancy at [redacted]w[redacted]d weeks   TOLAC Plan:     TOLAC consent completed.  Prenatal vitamins.  Counseling provided regarding continued use of seat belts, cessation of alcohol consumption, smoking or use of illicit drugs; infection precautions i.e., influenza/TDAP immunizations, toxoplasmosis,CMV, parvovirus, listeria and varicella; workplace safety, exercise during pregnancy; routine dental care, safe medications, sexual activity, hot tubs, saunas, pools, travel, caffeine use, fish and methlymercury, potential toxins, hair treatments, varicose veins Weight gain recommendations per IOM guidelines reviewed: underweight/BMI< 18.5--> gain 28 - 40 lbs; normal weight/BMI 18.5 - 24.9--> gain 25 - 35 lbs; overweight/BMI 25 - 29.9--> gain 15 - 25 lbs; obese/BMI >30->gain  11 - 20 lbs Problem list reviewed and updated. FIRST/CF mutation testing/NIPT/QUAD SCREEN/fragile X/Ashkenazi Jewish population testing/Spinal muscular atrophy discussed: ordered. Role of ultrasound in pregnancy discussed; fetal survey: ordered. Amniocentesis discussed: not indicated. VBAC calculator score: 60% VBAC consent form provided Meds ordered this encounter  Medications  . Prenatal Vit-Fe Fumarate-FA (MULTIVITAMIN-PRENATAL) 27-0.8 MG TABS tablet    Sig: Take 1 tablet by mouth daily at 12 noon.   Orders Placed This Encounter  Procedures  . Culture, OB Urine  . MaterniT21  plus Core+ESS+SCA, Blood    Order Specific Question:   Is the patient insulin dependent?    Answer:   No    Order Specific Question:   Please enter gestational age. This should be expressed as weeks AND days, i.e. 16w 6d. Enter weeks here. Enter days in next question.    Answer:   19    Order Specific Question:   Please enter gestational age. This should be expressed as weeks AND days, i.e. 16w 6d. Enter days here. Enter weeks in previous question.    Answer:   2    Order  Specific Question:   How was gestational age calculated?    Answer:   LMP    Order Specific Question:   Please give the date of LMP OR Ultrasound OR Estimated date of delivery.    Answer:   09/16/2016    Order Specific Question:   Number of Fetuses (Type of Pregnancy):    Answer:   1    Order Specific Question:   Indications for performing the test? (please choose all that apply):    Answer:   Routine screening    Order Specific Question:   If this is a repeat specimen, please indicate the reason:    Answer:   Early gestational age    Order  Specific Question:   Please specify the patient's race: (C=White/Caucasion, B=Black, I=Native American, A=Asian, H=Hispanic, O=Other, U=Unknown)    Answer:   B    Order Specific Question:   Donor Egg - indicate if the egg was obtained from in vitro fertilization.    Answer:   N    Order Specific Question:   Age of Egg Donor.    Answer:   0    Order Specific Question:   Prior Down Syndrome/ONTD screening during current pregnancy.    Answer:   N    Order Specific Question:   Prior First Trimester Testing    Answer:   N    Order Specific Question:   Prior Second Trimester Testing    Answer:   N    Order Specific Question:   Family History of Neural Tube Defects    Answer:   N    Order Specific Question:   Prior Pregnancy with Down Syndrome    Answer:   N    Order Specific Question:   Please give the patient's weight (in pounds)    Answer:   149  . TSH  . Varicella zoster antibody, IgG  . Obstetric Panel, Including HIV  . Hemoglobin A1c  . ToxASSURE Select 13 (MW), Urine  . Cystic Fibrosis Mutation 97  . Hemoglobinopathy evaluation    Follow up in 4 weeks. 50% of 30 min visit spent on counseling and coordination of care.

## 2017-01-01 NOTE — Addendum Note (Signed)
Addended by: Dalphine HandingGARDNER, Desire Fulp L on: 01/01/2017 04:59 PM   Modules accepted: Orders

## 2017-01-03 LAB — NUSWAB VAGINITIS PLUS (VG+)
CANDIDA ALBICANS, NAA: NEGATIVE
CANDIDA GLABRATA, NAA: NEGATIVE
Chlamydia trachomatis, NAA: NEGATIVE
NEISSERIA GONORRHOEAE, NAA: NEGATIVE
Trich vag by NAA: NEGATIVE

## 2017-01-03 LAB — CYTOLOGY - PAP
DIAGNOSIS: NEGATIVE
HPV: NOT DETECTED

## 2017-01-06 LAB — CULTURE, OB URINE

## 2017-01-06 LAB — URINE CULTURE, OB REFLEX

## 2017-01-06 LAB — TOXASSURE SELECT 13 (MW), URINE

## 2017-01-08 LAB — OBSTETRIC PANEL, INCLUDING HIV
Antibody Screen: NEGATIVE
BASOS ABS: 0 10*3/uL (ref 0.0–0.2)
Basos: 0 %
EOS (ABSOLUTE): 0.1 10*3/uL (ref 0.0–0.4)
Eos: 1 %
HIV Screen 4th Generation wRfx: NONREACTIVE
Hematocrit: 33.3 % — ABNORMAL LOW (ref 34.0–46.6)
Hemoglobin: 10.9 g/dL — ABNORMAL LOW (ref 11.1–15.9)
Hepatitis B Surface Ag: NEGATIVE
IMMATURE GRANS (ABS): 0 10*3/uL (ref 0.0–0.1)
IMMATURE GRANULOCYTES: 0 %
LYMPHS ABS: 2.1 10*3/uL (ref 0.7–3.1)
Lymphs: 22 %
MCH: 30.2 pg (ref 26.6–33.0)
MCHC: 32.7 g/dL (ref 31.5–35.7)
MCV: 92 fL (ref 79–97)
MONOS ABS: 0.8 10*3/uL (ref 0.1–0.9)
Monocytes: 8 %
NEUTROS PCT: 69 %
Neutrophils Absolute: 6.6 10*3/uL (ref 1.4–7.0)
PLATELETS: 208 10*3/uL (ref 150–379)
RBC: 3.61 x10E6/uL — AB (ref 3.77–5.28)
RDW: 13.5 % (ref 12.3–15.4)
RH TYPE: POSITIVE
RPR Ser Ql: NONREACTIVE
Rubella Antibodies, IGG: 19.6 index (ref >0.99)
WBC: 9.7 10*3/uL (ref 3.4–10.8)

## 2017-01-08 LAB — HEMOGLOBIN A1C
ESTIMATED AVERAGE GLUCOSE: 97 mg/dL
HEMOGLOBIN A1C: 5 % (ref 4.8–5.6)

## 2017-01-08 LAB — HEMOGLOBINOPATHY EVALUATION
HGB C: 0 %
HGB S: 0 %
HGB VARIANT: 0 %
Hemoglobin A2 Quantitation: 2.7 % (ref 1.8–3.2)
Hemoglobin F Quantitation: 0 % (ref 0.0–2.0)
Hgb A: 97.3 % (ref 96.4–98.8)

## 2017-01-08 LAB — TSH: TSH: 0.808 u[IU]/mL (ref 0.450–4.500)

## 2017-01-08 LAB — CYSTIC FIBROSIS MUTATION 97: GENE DIS ANAL CARRIER INTERP BLD/T-IMP: NOT DETECTED

## 2017-01-08 LAB — VARICELLA ZOSTER ANTIBODY, IGG: Varicella zoster IgG: 875 index (ref >165)

## 2017-01-09 ENCOUNTER — Other Ambulatory Visit: Payer: Self-pay | Admitting: Certified Nurse Midwife

## 2017-01-09 DIAGNOSIS — Z3480 Encounter for supervision of other normal pregnancy, unspecified trimester: Secondary | ICD-10-CM

## 2017-01-09 LAB — MATERNIT21  PLUS CORE+ESS+SCA, BLOOD
Chromosome 13: NEGATIVE
Chromosome 18: NEGATIVE
Chromosome 21: NEGATIVE
PDF: 0
Y CHROMOSOME: DETECTED

## 2017-01-29 ENCOUNTER — Ambulatory Visit (HOSPITAL_COMMUNITY): Payer: BC Managed Care – PPO

## 2017-01-29 ENCOUNTER — Ambulatory Visit (HOSPITAL_COMMUNITY)
Admission: RE | Admit: 2017-01-29 | Discharge: 2017-01-29 | Disposition: A | Discharge status: Home / Self Care | Payer: BC Managed Care – PPO | Source: Ambulatory Visit | Attending: Certified Nurse Midwife | Admitting: Certified Nurse Midwife

## 2017-01-29 ENCOUNTER — Other Ambulatory Visit: Payer: Self-pay | Admitting: Certified Nurse Midwife

## 2017-01-29 DIAGNOSIS — Z3689 Encounter for other specified antenatal screening: Secondary | ICD-10-CM

## 2017-01-29 DIAGNOSIS — Z3A19 19 weeks gestation of pregnancy: Secondary | ICD-10-CM

## 2017-01-29 DIAGNOSIS — O34211 Maternal care for low transverse scar from previous cesarean delivery: Secondary | ICD-10-CM | POA: Insufficient documentation

## 2017-01-29 DIAGNOSIS — O09529 Supervision of elderly multigravida, unspecified trimester: Secondary | ICD-10-CM

## 2017-01-29 DIAGNOSIS — Z363 Encounter for antenatal screening for malformations: Secondary | ICD-10-CM | POA: Diagnosis not present

## 2017-01-29 DIAGNOSIS — O34219 Maternal care for unspecified type scar from previous cesarean delivery: Secondary | ICD-10-CM

## 2017-01-29 DIAGNOSIS — O09522 Supervision of elderly multigravida, second trimester: Secondary | ICD-10-CM | POA: Insufficient documentation

## 2017-02-04 ENCOUNTER — Encounter: Payer: BC Managed Care – PPO | Admitting: Certified Nurse Midwife

## 2017-02-04 ENCOUNTER — Ambulatory Visit (INDEPENDENT_AMBULATORY_CARE_PROVIDER_SITE_OTHER): Payer: BC Managed Care – PPO | Admitting: Certified Nurse Midwife

## 2017-02-04 VITALS — BP 116/75 | HR 82 | Wt 155.0 lb

## 2017-02-04 DIAGNOSIS — O09522 Supervision of elderly multigravida, second trimester: Secondary | ICD-10-CM

## 2017-02-04 DIAGNOSIS — O09529 Supervision of elderly multigravida, unspecified trimester: Secondary | ICD-10-CM

## 2017-02-04 DIAGNOSIS — O34219 Maternal care for unspecified type scar from previous cesarean delivery: Secondary | ICD-10-CM

## 2017-02-04 DIAGNOSIS — Z98891 History of uterine scar from previous surgery: Secondary | ICD-10-CM

## 2017-02-04 NOTE — Progress Notes (Signed)
   PRENATAL VISIT NOTE  Subjective:  [name] is a 36 y.o. [phone] at 621w1d being seen today for ongoing prenatal care.  She is currently monitored for the following issues for this high-risk pregnancy and has History of C-section; Anemia; Fatigue; and High-risk pregnancy, multigravida of advanced maternal age, antepartum on her problem list.  Patient reports backache, no bleeding, no contractions, no cramping and no leaking.  Contractions: Not present. Vag. Bleeding: None.  Movement: Present. Denies leaking of fluid.   The following portions of the patient's history were reviewed and updated as appropriate: allergies, current medications, past family history, past medical history, past social history, past surgical history and problem list. Problem list updated.  Objective:   Vitals:   02/04/17 1603  BP: 116/75  Pulse: 82  Weight: 155 lb (70.3 kg)    Fetal Status: Fetal Heart Rate (bpm): 155 Fundal Height: 20 cm Movement: Present     General:  Alert, oriented and cooperative. Patient is in no acute distress.  Skin: Skin is warm and dry. No rash noted.   Cardiovascular: Normal heart rate noted  Respiratory: Normal respiratory effort, no problems with respiration noted  Abdomen: Soft, gravid, appropriate for gestational age. Pain/Pressure: Absent     Pelvic:  Cervical exam deferred        Extremities: Normal range of motion.     Mental Status: Normal mood and affect. Normal behavior. Normal judgment and thought content.   Assessment and Plan:  Pregnancy: [id number] at 751w1d  1. History of C-section     VBAC planned  2. High-risk pregnancy, multigravida of advanced maternal age, antepartum     AMA - US MFM OB FOLLOW UP; Future  Preterm labor symptoms and general obstetric precautions including but not limited to vaginal bleeding, contractions, leaking of fluid and fetal movement were reviewed in detail with the patient. Please refer to After Visit Summary for other  counseling recommendations.  Return in about 4 weeks (around 03/04/2017) [facility]   Roe Coombsachelle A Tenoch Mcclure, CNM

## 2017-02-04 NOTE — Progress Notes (Signed)
Pt states she has some numbness in legs when she sleeps. 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

## 2017-03-04 ENCOUNTER — Ambulatory Visit (HOSPITAL_COMMUNITY): Payer: BC Managed Care – PPO

## 2017-03-04 ENCOUNTER — Encounter: Payer: Self-pay | Admitting: Obstetrics and Gynecology

## 2017-03-04 ENCOUNTER — Encounter: Payer: BC Managed Care – PPO | Admitting: Certified Nurse Midwife

## 2017-03-04 ENCOUNTER — Ambulatory Visit (INDEPENDENT_AMBULATORY_CARE_PROVIDER_SITE_OTHER): Payer: BC Managed Care – PPO | Admitting: Obstetrics and Gynecology

## 2017-03-04 VITALS — BP 106/74 | HR 86 | Wt 161.0 lb

## 2017-03-04 DIAGNOSIS — O34219 Maternal care for unspecified type scar from previous cesarean delivery: Secondary | ICD-10-CM

## 2017-03-04 DIAGNOSIS — N736 Female pelvic peritoneal adhesions (postinfective): Secondary | ICD-10-CM

## 2017-03-04 DIAGNOSIS — O09522 Supervision of elderly multigravida, second trimester: Secondary | ICD-10-CM | POA: Insufficient documentation

## 2017-03-04 DIAGNOSIS — O09529 Supervision of elderly multigravida, unspecified trimester: Secondary | ICD-10-CM

## 2017-03-04 DIAGNOSIS — Z98891 History of uterine scar from previous surgery: Secondary | ICD-10-CM

## 2017-03-04 NOTE — Progress Notes (Signed)
Prenatal Visit Note Date: 03/04/2017 Clinic: [facility]  Subjective:  [name] is a 36 y.o. [id number] at 2678w1d being seen today for ongoing prenatal care.  She is currently monitored for the following issues for this high-risk pregnancy and has History of C-section; Anemia; Fatigue; High-risk pregnancy, multigravida of advanced maternal age, antepartum; and AMA (advanced maternal age) multigravida 35+, second trimester on her problem list.  Patient reports no complaints.   Contractions: Not present.  .  Movement: Present. Denies leaking of fluid.   The following portions of the patient's history were reviewed and updated as appropriate: allergies, current medications, past family history, past medical history, past social history, past surgical history and problem list. Problem list updated.  Objective:   Vitals:   03/04/17 1607  BP: 106/74  Pulse: 86  Weight: 161 lb (73 kg)    Fetal Status: Fetal Heart Rate (bpm): 150 Fundal Height: 24 cm Movement: Present     General:  Alert, oriented and cooperative. Patient is in no acute distress.  Skin: Skin is warm and dry. No rash noted.   Cardiovascular: Normal heart rate noted  Respiratory: Normal respiratory effort, no problems with respiration noted  Abdomen: Soft, gravid, appropriate for gestational age. Pain/Pressure: Absent     Pelvic:  Cervical exam deferred        Extremities: Normal range of motion.     Mental Status: Normal mood and affect. Normal behavior. Normal judgment and thought content.   Urinalysis: Urine Protein: Negative Urine Glucose: Negative  Assessment and Plan:  Pregnancy: [id number] at 3878w1d  1. High-risk pregnancy, multigravida of advanced maternal age, antepartum Routine care. D/w her re: BC at next visit. 28wk labs nv. F/u completion anatomy scan this week.   2. History of C-section Op notes reviewed and PL updated and recommended to patient that she not do a TOLAC. Pt to consider  and will d/w her more at subsequent visits  3. AMA (advanced maternal age) multigravida 35+, second trimester F/u anatomy scan completion. Neg Mat21 testing.   Preterm labor symptoms and general obstetric precautions including but not limited to vaginal bleeding, contractions, leaking of fluid and fetal movement were reviewed in detail with the patient. Please refer to After Visit Summary for other counseling recommendations.  Return in about 3 weeks (around 03/25/2017) for rob with 2hr GTT. dr Alysia Pennaervin or constant. [place].   Eleuterio Dollar, MD

## 2017-03-06 ENCOUNTER — Other Ambulatory Visit: Payer: Self-pay | Admitting: Certified Nurse Midwife

## 2017-03-06 ENCOUNTER — Ambulatory Visit (HOSPITAL_COMMUNITY)
Admission: RE | Admit: 2017-03-06 | Discharge: 2017-03-06 | Disposition: A | Discharge status: Home / Self Care | Payer: BC Managed Care – PPO | Source: Ambulatory Visit | Attending: Certified Nurse Midwife | Admitting: Certified Nurse Midwife

## 2017-03-06 DIAGNOSIS — Z3A24 24 weeks gestation of pregnancy: Secondary | ICD-10-CM

## 2017-03-06 DIAGNOSIS — O09522 Supervision of elderly multigravida, second trimester: Secondary | ICD-10-CM | POA: Diagnosis present

## 2017-03-06 DIAGNOSIS — O09529 Supervision of elderly multigravida, unspecified trimester: Secondary | ICD-10-CM

## 2017-03-06 DIAGNOSIS — O34211 Maternal care for low transverse scar from previous cesarean delivery: Secondary | ICD-10-CM | POA: Insufficient documentation

## 2017-03-07 ENCOUNTER — Other Ambulatory Visit: Payer: Self-pay | Admitting: Certified Nurse Midwife

## 2017-03-07 DIAGNOSIS — O09529 Supervision of elderly multigravida, unspecified trimester: Secondary | ICD-10-CM

## 2017-03-27 ENCOUNTER — Encounter: Payer: BC Managed Care – PPO | Admitting: Obstetrics and Gynecology

## 2017-03-27 ENCOUNTER — Other Ambulatory Visit: Payer: BC Managed Care – PPO

## 2017-04-03 ENCOUNTER — Encounter (HOSPITAL_COMMUNITY): Payer: Self-pay

## 2017-04-03 ENCOUNTER — Encounter (HOSPITAL_COMMUNITY): Payer: Self-pay | Admitting: Obstetrics & Gynecology

## 2017-04-03 ENCOUNTER — Ambulatory Visit (INDEPENDENT_AMBULATORY_CARE_PROVIDER_SITE_OTHER): Payer: BC Managed Care – PPO | Admitting: Obstetrics & Gynecology

## 2017-04-03 ENCOUNTER — Other Ambulatory Visit: Payer: BC Managed Care – PPO

## 2017-04-03 DIAGNOSIS — Z23 Encounter for immunization: Secondary | ICD-10-CM

## 2017-04-03 DIAGNOSIS — O09529 Supervision of elderly multigravida, unspecified trimester: Secondary | ICD-10-CM

## 2017-04-03 DIAGNOSIS — O09523 Supervision of elderly multigravida, third trimester: Secondary | ICD-10-CM

## 2017-04-03 NOTE — Patient Instructions (Signed)
Cesarean Delivery °Cesarean birth, or cesarean delivery, is the surgical delivery of a baby through an incision in the abdomen and the uterus. This may be referred to as a C-section. This procedure may be scheduled ahead of time, or it may be done in an emergency situation. °Tell a health care provider about: °· Any allergies you have. °· All medicines you are taking, including vitamins, herbs, eye drops, creams, and over-the-counter medicines. °· Any problems you or family members have had with anesthetic medicines. °· Any blood disorders you have. °· Any surgeries you have had. °· Any medical conditions you have. °· Whether you or any members of your family have a history of deep vein thrombosis (DVT) or pulmonary embolism (PE). °What are the risks? °Generally, this is a safe procedure. However, problems may occur, including: °· Infection. °· Bleeding. °· Allergic reactions to medicines. °· Damage to other structures or organs. °· Blood clots. °· Injury to your baby. ° °What happens before the procedure? °· Follow instructions from your health care provider about eating or drinking restrictions. °· Follow instructions from your health care provider about bathing before your procedure to help reduce your risk of infection. °· If you know that you are going to have a cesarean delivery, do not shave your pubic area. Shaving before the procedure may increase your risk of infection. °· Ask your health care provider about: °? Changing or stopping your regular medicines. This is especially important if you are taking diabetes medicines or blood thinners. °? Your pain management plan. This is especially important if you plan to breastfeed your baby. °? How long you will be in the hospital after the procedure. °? Any concerns you may have about receiving blood products if you need them during the procedure. °? Cord blood banking, if you plan to collect your baby’s umbilical cord blood. °· You may also want to ask your  health care provider: °? Whether you will be able to hold or breastfeed your baby while you are still in the operating room. °? Whether your baby can stay with you immediately after the procedure and during your recovery. °? Whether a family member or a person of your choice can go with you into the operating room and stay with you during the procedure, immediately after the procedure, and during your recovery. °· Plan to have someone drive you home when you are discharged from the hospital. °What happens during the procedure? °· Fetal monitors will be placed on your abdomen to monitor your heart rate and your baby's heart rate. °· Depending on the reason for your cesarean delivery, you may have a physical exam or additional testing, such as an ultrasound. °· An IV tube will be inserted into one of your veins. °· You may have your blood or urine tested. °· You will be given antibiotic medicine to help prevent infection. °· You may be given a special warming gown to wear to keep your temperature stable. °· Hair may be removed from your pubic area. °· The skin of your pubic area and lower abdomen will be cleaned with a germ-killing solution (antiseptic). °· A catheter may be inserted into your bladder through your urethra. This drains your urine during the procedure. °· You may be given one or more of the following: °? A medicine to numb the area (local anesthetic). °? A medicine to make you fall asleep (general anesthetic). °? A medicine (regional anesthetic) that is injected into your back or through a small   thin tube placed in your back (spinal anesthetic or epidural anesthetic). This numbs everything below the injection site and allows you to stay awake during your procedure. If this makes you feel nauseous, tell your health care provider. Medicines will be available to help reduce any nausea you may feel. °· An incision will be made in your abdomen, and then in your uterus. °· If you are awake during your  procedure, you may feel tugging and pulling in your abdomen, but you should not feel pain. If you feel pain, tell your health care provider immediately. °· Your baby will be removed from your uterus. You may feel more pressure or pushing while this happens. °· Immediately after birth, your baby will be dried and kept warm. You may be able to hold and breastfeed your baby. The umbilical cord may be clamped and cut during this time. °· Your placenta will be removed from your uterus. °· Your incisions will be closed with stitches (sutures). Staples, skin glue, or adhesive strips may also be applied to the incision in your abdomen. °· Bandages (dressings) will be placed over the incision in your abdomen. °The procedure may vary among health care providers and hospitals. °What happens after the procedure? °· Your blood pressure, heart rate, breathing rate, and blood oxygen level will be monitored often until the medicines you were given have worn off. °· You may continue to receive fluids and medicines through an IV tube. °· You will have some pain. Medicines will be available to help control your pain. °· To help prevent blood clots: °? You may be given medicines. °? You may have to wear compression stockings or devices. °? You will be encouraged to walk around when you are able. °· Hospital staff will encourage and support bonding with your baby. Your hospital may allow you and your baby to stay in the same room (rooming in) during your hospital stay to encourage successful breastfeeding. °· You may be encouraged to cough and breathe deeply often. This helps to prevent lung problems. °· If you have a catheter draining your urine, it will be removed as soon as possible after your procedure. °This information is not intended to replace advice given to you by your health care provider. Make sure you discuss any questions you have with your health care provider. °Document Released: 12/17/2005 Document Revised: 05/24/2016  Document Reviewed: 09/27/2015 °Elsevier Interactive Patient Education © 2017 [employer] ° °

## 2017-04-03 NOTE — Progress Notes (Signed)
Patient reports good fetal movement, denies pain. 

## 2017-04-03 NOTE — Progress Notes (Signed)
   PRENATAL VISIT NOTE  Subjective:  [name] is a 36 y.o. [phone] at [redacted]w[redacted]d being seen today for ongoing prenatal care.  She is currently monitored for the following issues for this low-risk pregnancy and has History of C-section; Anemia; Fatigue; High-risk pregnancy, multigravida of advanced maternal age, antepartum; AMA (advanced maternal age) multigravida 35+, second trimester; and Pelvic adhesive disease on her problem list.  Patient reports no complaints.  Contractions: Not present. Vag. Bleeding: None.  Movement: Present. Denies leaking of fluid.   The following portions of the patient's history were reviewed and updated as appropriate: allergies, current medications, past family history, past medical history, past social history, past surgical history and problem list. Problem list updated.  Objective:   Vitals:   04/03/17 0907  BP: 112/74  Pulse: 98  Weight: 167 lb 3.2 oz (75.8 kg)    Fetal Status: Fetal Heart Rate (bpm): 142   Movement: Present     General:  Alert, oriented and cooperative. Patient is in no acute distress.  Skin: Skin is warm and dry. No rash noted.   Cardiovascular: Normal heart rate noted  Respiratory: Normal respiratory effort, no problems with respiration noted  Abdomen: Soft, gravid, appropriate for gestational age. Pain/Pressure: Absent     Pelvic:  Cervical exam deferred        Extremities: Normal range of motion.  Edema: Trace  Mental Status: Normal mood and affect. Normal behavior. Normal judgment and thought content.   Assessment and Plan:  Pregnancy: [id number] at [redacted]w[redacted]d  1. High-risk pregnancy, multigravida of advanced maternal age, antepartum  - Glucose Tolerance, 2 Hours w/1 Hour - HIV antibody - RPR - CBC - Tdap vaccine greater than or equal to 7yo IM  Preterm labor symptoms and general obstetric precautions including but not limited to vaginal bleeding, contractions, leaking of fluid and fetal movement were reviewed in detail with  the patient. Please refer to After Visit Summary for other counseling recommendations.  Return in about 2 weeks (around 04/17/2017). Repeat cesarean section 39 weeks  Adam Phenix, MD

## 2017-04-04 LAB — CBC
Hematocrit: 29.6 % — ABNORMAL LOW (ref 34.0–46.6)
Hemoglobin: 9.8 g/dL — ABNORMAL LOW (ref 11.1–15.9)
MCH: 30.2 pg (ref 26.6–33.0)
MCHC: 33.1 g/dL (ref 31.5–35.7)
MCV: 91 fL (ref 79–97)
Platelets: 171 10*3/uL (ref 150–379)
RBC: 3.24 x10E6/uL — AB (ref 3.77–5.28)
RDW: 13.7 % (ref 12.3–15.4)
WBC: 9.8 10*3/uL (ref 3.4–10.8)

## 2017-04-04 LAB — GLUCOSE TOLERANCE, 2 HOURS W/ 1HR
GLUCOSE, 2 HOUR: 90 mg/dL (ref 65–152)
Glucose, 1 hour: 133 mg/dL (ref 65–179)
Glucose, Fasting: 73 mg/dL (ref 65–91)

## 2017-04-04 LAB — HIV ANTIBODY (ROUTINE TESTING W REFLEX): HIV SCREEN 4TH GENERATION: NONREACTIVE

## 2017-04-04 LAB — RPR: RPR: NONREACTIVE

## 2017-04-17 ENCOUNTER — Ambulatory Visit (INDEPENDENT_AMBULATORY_CARE_PROVIDER_SITE_OTHER): Payer: BC Managed Care – PPO | Admitting: Obstetrics & Gynecology

## 2017-04-17 VITALS — BP 126/80 | HR 101 | Wt 168.4 lb

## 2017-04-17 DIAGNOSIS — O09523 Supervision of elderly multigravida, third trimester: Secondary | ICD-10-CM

## 2017-04-17 DIAGNOSIS — O34219 Maternal care for unspecified type scar from previous cesarean delivery: Secondary | ICD-10-CM

## 2017-04-17 DIAGNOSIS — Z98891 History of uterine scar from previous surgery: Secondary | ICD-10-CM

## 2017-04-17 DIAGNOSIS — O09529 Supervision of elderly multigravida, unspecified trimester: Secondary | ICD-10-CM

## 2017-04-17 NOTE — Progress Notes (Signed)
Patient is feeling an increase in pressure in pelvis and legs.

## 2017-04-17 NOTE — Progress Notes (Signed)
   PRENATAL VISIT NOTE  Subjective:  [name] is a 36 y.o. [phone] at [redacted]w[redacted]d being seen today for ongoing prenatal care.  She is currently monitored for the following issues for this low-risk pregnancy and has History of C-section; Anemia; Fatigue; High-risk pregnancy, multigravida of advanced maternal age, antepartum; AMA (advanced maternal age) multigravida 35+, second trimester; and Pelvic adhesive disease on her problem list.  Patient reports pelvic pressure.  Contractions: Not present. Vag. Bleeding: None.  Movement: Present. Denies leaking of fluid.   The following portions of the patient's history were reviewed and updated as appropriate: allergies, current medications, past family history, past medical history, past social history, past surgical history and problem list. Problem list updated.  Objective:   Vitals:   04/17/17 1528  BP: 126/80  Pulse: (!) 101  Weight: 168 lb 6.4 oz (76.4 kg)    Fetal Status: Fetal Heart Rate (bpm): 143   Movement: Present     General:  Alert, oriented and cooperative. Patient is in no acute distress.  Skin: Skin is warm and dry. No rash noted.   Cardiovascular: Normal heart rate noted  Respiratory: Normal respiratory effort, no problems with respiration noted  Abdomen: Soft, gravid, appropriate for gestational age. Pain/Pressure: Absent     Pelvic:  Cervical exam deferred        Extremities: Normal range of motion.  Edema: None  Mental Status: Normal mood and affect. Normal behavior. Normal judgment and thought content.   Assessment and Plan:  Pregnancy: [id number] at [redacted]w[redacted]d  1. High-risk pregnancy, multigravida of advanced maternal age, antepartum Doing well, discomforts of pregnancy  2. History of C-section RCS 6/17 at 39 weeks, discussed contraceptive method today  Preterm labor symptoms and general obstetric precautions including but not limited to vaginal bleeding, contractions, leaking of fluid and fetal movement were reviewed in  detail with the patient. Please refer to After Visit Summary for other counseling recommendations.  Return in about 2 weeks (around 05/01/2017).   Adam Phenix, MD

## 2017-04-17 NOTE — Patient Instructions (Signed)

## 2017-04-25 ENCOUNTER — Ambulatory Visit (INDEPENDENT_AMBULATORY_CARE_PROVIDER_SITE_OTHER): Payer: BC Managed Care – PPO | Admitting: Obstetrics and Gynecology

## 2017-04-25 VITALS — BP 105/72 | HR 94 | Wt 167.0 lb

## 2017-04-25 DIAGNOSIS — O09529 Supervision of elderly multigravida, unspecified trimester: Secondary | ICD-10-CM

## 2017-04-25 DIAGNOSIS — O09522 Supervision of elderly multigravida, second trimester: Secondary | ICD-10-CM

## 2017-04-25 DIAGNOSIS — Z98891 History of uterine scar from previous surgery: Secondary | ICD-10-CM

## 2017-04-25 NOTE — Progress Notes (Signed)
Patient states that last night she had irregular contractions that lasted about 30 seconds for about 2 hours, patient rated the pain as an 8.  She reports good fetal movement, denies pain/contractions and no bleeding today.

## 2017-04-25 NOTE — Progress Notes (Signed)
Subjective:  [name] is a 36 y.o. [phone] at [redacted]w[redacted]d being seen today for ongoing prenatal care.  She is currently monitored for the following issues for this high-risk pregnancy and has History of C-section; Anemia; Fatigue; High-risk pregnancy, multigravida of advanced maternal age, antepartum; AMA (advanced maternal age) multigravida 35+, second trimester; and Pelvic adhesive disease on her problem list.  Patient reports contractions last night. Have resolved. No recent IC, VB or LOF.  Contractions: Not present. Vag. Bleeding: None.  Movement: Present. Denies leaking of fluid.   The following portions of the patient's history were reviewed and updated as appropriate: allergies, current medications, past family history, past medical history, past social history, past surgical history and problem list. Problem list updated.  Objective:   Vitals:   04/25/17 1114  BP: 105/72  Pulse: 94  Weight: 167 lb (75.8 kg)    Fetal Status: Fetal Heart Rate (bpm): 152   Movement: Present     General:  Alert, oriented and cooperative. Patient is in no acute distress.  Skin: Skin is warm and dry. No rash noted.   Cardiovascular: Normal heart rate noted  Respiratory: Normal respiratory effort, no problems with respiration noted  Abdomen: Soft, gravid, appropriate for gestational age. Pain/Pressure: Absent     Pelvic:  Cervical exam performed        Extremities: Normal range of motion.  Edema: Trace  Mental Status: Normal mood and affect. Normal behavior. Normal judgment and thought content.   Urinalysis:      Assessment and Plan:  Pregnancy: [id number] at [redacted]w[redacted]d  1. High-risk pregnancy, multigravida of advanced maternal age, antepartum Stable Increase water intake  2. AMA (advanced maternal age) multigravida 35+, second trimester Nl Mat 21  3. History of C-section RLTCS scheduled for 06/16/17 Does not want a tubal  Preterm labor symptoms and general obstetric precautions including but not  limited to vaginal bleeding, contractions, leaking of fluid and fetal movement were reviewed in detail with the patient. Please refer to After Visit Summary for other counseling recommendations.  Return in about 2 weeks (around 05/09/2017).   Hermina Staggers, MD

## 2017-05-01 ENCOUNTER — Encounter: Payer: BC Managed Care – PPO | Admitting: Obstetrics and Gynecology

## 2017-05-02 ENCOUNTER — Encounter: Payer: Self-pay | Admitting: Obstetrics and Gynecology

## 2017-05-08 ENCOUNTER — Other Ambulatory Visit: Payer: Self-pay | Admitting: Obstetrics and Gynecology

## 2017-05-13 ENCOUNTER — Encounter: Payer: BC Managed Care – PPO | Admitting: Obstetrics and Gynecology

## 2017-05-28 LAB — OB RESULTS CONSOLE GBS: STREP GROUP B AG: NEGATIVE

## 2017-06-11 ENCOUNTER — Telehealth (HOSPITAL_COMMUNITY): Payer: Self-pay | Admitting: *Deleted

## 2017-06-11 NOTE — Telephone Encounter (Signed)
Preadmission screen  

## 2017-06-12 ENCOUNTER — Encounter (HOSPITAL_COMMUNITY): Payer: Self-pay

## 2017-06-15 NOTE — H&P (Signed)
[name] is a 36 y.o. female, [id number] at 2839 3/7 weeks, presenting on 06/19/17 for scheduled repeat C/S--no tubal.  Hx of 2 previous C/S, with desire for repeat.  Hx of pelvic adhesive disease noted on last C/S 2013.  Patient Active Problem List   Diagnosis Date Noted  . AMA (advanced maternal age) multigravida 35+, second trimester 03/04/2017  . 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 Previous cesarean section x 2 05/01/2012  . Anemia 05/01/2012    History of present pregnancy: Patient entered care at [facility] at 33 weeks, [facility] EDC of 06/23/17 was established by LMP and in agreement with US at 19 weeks.   Anatomy scan:  19 weeks, with limited anatomy findings and an anterior placenta.   Additional US evaluations:  24 3/7 weeks:  Completion of anatomy, transverse lie, AFV WNL, EFW 53%ile, cervix 3.4 cm. 34 3/7 weeks:  EFW 5 lbs, 25%ile, vtx, AFI 11.85, 30%ile.    Significant prenatal events:  Transfer in at 33 weeks, uncomplicated PN course prior.  Initially considered VBAC, then decided on repeat C/S, no tubal.  On Fe supplement due to Hgb 9.8 at 28 weeks.  Normal glucose testing.  Normotensive during pregnancy Last seen:  06/18/17 at office.  OB History    Gravida Para Term Preterm AB Living   4 2 2  0 1 2   SAB TAB Ectopic Multiple Live Births   1 0 0 0 2    2006--Primary LTCS, 39 3.7 weeks, 8+6, delivered by Dr. Kendrick Friesivard/VL, FTP, chorio 2013--Repeat LTCS, failed VBAC, FTD, 8+7, delivered by Dr. Stefano GaulStringer. SAB--? Year  Past Medical History:  Diagnosis Date  . Allergy   . Anemia   . Chicken pox   . Pelvic adhesive disease    noted at 2nd c-section op note in Epic.    Past Surgical History:  Procedure Laterality Date  . CESAREAN SECTION    . CESAREAN SECTION  06/18/2012   Procedure: CESAREAN SECTION;  Surgeon: Kirkland HunArthur Stringer, MD;  Location: [facility];  Service: Gynecology;  Laterality: N/A;  with delivery of baby boy at  [id number]  . DENTAL SURGERY     Family History: family history includes Allergic rhinitis in her father, mother, and sister; Arthritis in her maternal grandmother; Cancer in her maternal grandfather; Diabetes in her maternal grandmother; Food Allergy in her sister and son; Hypertension in her maternal grandmother and mother; Prostate cancer in her maternal grandfather.   Social History:  reports that she has never smoked. She has never used smokeless tobacco. She reports that she does not drink alcohol or use drugs.  Patient is African-American, married, with husband, Arta Silenceaul Wilson, involved and supportive.  She is college-educated, employed as a Runner, broadcasting/film/videoteacher.   Prenatal Transfer Tool  Maternal Diabetes: No Genetic Screening: Normal MaterniT21 and CF testing Maternal Ultrasounds/Referrals: Normal Fetal Ultrasounds or other Referrals:  None Maternal Substance Abuse:  No Significant Maternal Medications:  None Significant Maternal Lab Results: Lab values include: Group B Strep negative  TDAP 12/2016 Flu NA  ROS:  Occasional UCs, +FM  Allergies  Allergen Reactions  . Carrot [Daucus Carota]   . Shellfish Allergy     Hives, shrimp and lobster  . Watermelon [Citrullus Vulgaris] Hives       Last menstrual period 09/16/2016.  Chest clear Heart RRR without murmur Abd gravid, NT, FH 38 cm Pelvic: Deferred Ext: WNL  FHR: 143 at office visit UCs:  Occasional per patient  report  Prenatal labs: ABO, Rh: A/Positive/-- (01/02 1634) Antibody: Negative (01/02 1634) Rubella:  Immune RPR: Non Reactive (04/04 1105)  HBsAg: Negative (01/02 1634)  HIV: Non Reactive (04/04 1105)  GBS: Negative (05/29 0000) Sickle cell/Hgb electrophoresis:  AA Pap:  WNL 01/03/17 GC:  Negative 01/03/2017 Chlamydia: Negative 01/03/2017 Genetic screenings:  Normal MaterniT21 WNL, CF negative Varicella immune Glucola:  WNL Other:   Hgb 10.9 at NOB, 9.8 at 28 weeks, 10.9 on 04/04/17 Hgb A1C 5.0     Assessment/Plan: IUP  at 39 3/7 weeks Previous cesarean x 2, desires repeat, no tubal. Shellfish allergy  Plan: Admit to [facility] per consult with Dr. Estanislado Pandy Routine [facility] pre-op orders  Nigel Bridgeman CNM, [place] 06/15/2017, 11:32 AM

## 2017-06-16 ENCOUNTER — Inpatient Hospital Stay (HOSPITAL_COMMUNITY)
Admission: RE | Admit: 2017-06-16 | Payer: BC Managed Care – PPO | Source: Ambulatory Visit | Admitting: Obstetrics & Gynecology

## 2017-06-16 ENCOUNTER — Encounter (HOSPITAL_COMMUNITY): Admission: RE | Payer: Self-pay | Source: Ambulatory Visit

## 2017-06-16 SURGERY — Surgical Case
Anesthesia: Regional | Site: Abdomen

## 2017-06-17 NOTE — Patient Instructions (Signed)
20 [name]  06/17/2017   Your procedure is scheduled on:  06/19/2017  Enter through the [facility] Entr[facility] at 0900 AM.  Pick up the phone at the desk and dial [phone].   Call this number if you have problems the morning of surgery: [phone]   Remember:   Do not eat food:After Midnight.  Do not drink clear liquids: After Midnight.  Take these medicines the morning of surgery with A SIP OF WATER: none   Do not wear jewelry, make-up or nail polish.  Do not wear lotions, powders, or perfumes. Do not wear deodorant.  Do not shave 48 hours prior to surgery.  Do not bring valuables to the hospital.  [facility] not   responsible for any belongings or valuables brought to the hospital.  Contacts, dentures or bridgework may not be worn into surgery.  Leave suitcase in the car. After surgery it may be brought to your room.  For patients admitted to the hospital, checkout time is 11:00 AM the day of              discharge.   Patients discharged the day of surgery will not be allowed to drive             home.  Name and phone number of your driver: na  Special Instructions:   N/A   Please read over the following fact sheets that you were given:   Surgical Site Infection Prevention

## 2017-06-18 ENCOUNTER — Encounter (HOSPITAL_COMMUNITY)
Admission: RE | Admit: 2017-06-18 | Discharge: 2017-06-18 | Disposition: A | Discharge status: Home / Self Care | Payer: BC Managed Care – PPO | Source: Ambulatory Visit | Attending: Obstetrics and Gynecology | Admitting: Obstetrics and Gynecology

## 2017-06-18 LAB — CBC
HCT: 32.3 % — ABNORMAL LOW (ref 36.0–46.0)
HEMOGLOBIN: 10.7 g/dL — AB (ref 12.0–15.0)
MCH: 30.1 pg (ref 26.0–34.0)
MCHC: 33.1 g/dL (ref 30.0–36.0)
MCV: 91 fL (ref 78.0–100.0)
PLATELETS: 136 10*3/uL — AB (ref 150–400)
RBC: 3.55 MIL/uL — AB (ref 3.87–5.11)
RDW: 14.3 % (ref 11.5–15.5)
WBC: 8.2 10*3/uL (ref 4.0–10.5)

## 2017-06-18 LAB — TYPE AND SCREEN
ABO/RH(D): A POS
Antibody Screen: NEGATIVE

## 2017-06-19 ENCOUNTER — Inpatient Hospital Stay (HOSPITAL_COMMUNITY)
Admission: RE | Admit: 2017-06-19 | Discharge: 2017-06-21 | DRG: 766 | Disposition: A | Discharge status: Home / Self Care | Payer: BC Managed Care – PPO | Source: Ambulatory Visit | Attending: Obstetrics and Gynecology | Admitting: Obstetrics and Gynecology

## 2017-06-19 ENCOUNTER — Inpatient Hospital Stay (HOSPITAL_COMMUNITY): Payer: BC Managed Care – PPO | Admitting: Anesthesiology

## 2017-06-19 ENCOUNTER — Encounter (HOSPITAL_COMMUNITY)
Admission: RE | Disposition: A | Discharge status: Home / Self Care | Payer: Self-pay | Source: Ambulatory Visit | Attending: Obstetrics and Gynecology

## 2017-06-19 ENCOUNTER — Encounter (HOSPITAL_COMMUNITY): Payer: Self-pay | Admitting: *Deleted

## 2017-06-19 DIAGNOSIS — O9902 Anemia complicating childbirth: Secondary | ICD-10-CM | POA: Diagnosis present

## 2017-06-19 DIAGNOSIS — Z3A39 39 weeks gestation of pregnancy: Secondary | ICD-10-CM | POA: Diagnosis not present

## 2017-06-19 DIAGNOSIS — O34211 Maternal care for low transverse scar from previous cesarean delivery: Secondary | ICD-10-CM | POA: Diagnosis present

## 2017-06-19 DIAGNOSIS — D649 Anemia, unspecified: Secondary | ICD-10-CM | POA: Diagnosis present

## 2017-06-19 DIAGNOSIS — O09522 Supervision of elderly multigravida, second trimester: Secondary | ICD-10-CM | POA: Diagnosis present

## 2017-06-19 LAB — CBC
HCT: 32 % — ABNORMAL LOW (ref 36.0–46.0)
Hemoglobin: 10.7 g/dL — ABNORMAL LOW (ref 12.0–15.0)
MCH: 30.4 pg (ref 26.0–34.0)
MCHC: 33.4 g/dL (ref 30.0–36.0)
MCV: 90.9 fL (ref 78.0–100.0)
PLATELETS: 142 10*3/uL — AB (ref 150–400)
RBC: 3.52 MIL/uL — ABNORMAL LOW (ref 3.87–5.11)
RDW: 14.3 % (ref 11.5–15.5)
WBC: 13.8 10*3/uL — ABNORMAL HIGH (ref 4.0–10.5)

## 2017-06-19 LAB — RPR: RPR: NONREACTIVE

## 2017-06-19 LAB — CREATININE, SERUM
CREATININE: 0.56 mg/dL (ref 0.44–1.00)
GFR calc Af Amer: >60 mL/min (ref >60)
GFR calc non Af Amer: >60 mL/min (ref >60)

## 2017-06-19 SURGERY — Surgical Case
Anesthesia: Spinal

## 2017-06-19 MED ORDER — SIMETHICONE 80 MG PO CHEW
80.0000 mg | CHEWABLE_TABLET | Freq: Three times a day (TID) | ORAL | Status: DC
  Administered 2017-06-20 – 2017-06-21 (×4): 80 mg via ORAL
  Filled 2017-06-19 (×4): qty 1

## 2017-06-19 MED ORDER — ENOXAPARIN SODIUM 40 MG/0.4ML ~~LOC~~ SOLN
40.0000 mg | SUBCUTANEOUS | Status: DC
  Filled 2017-06-19 (×2): qty 0.4

## 2017-06-19 MED ORDER — NALBUPHINE HCL 10 MG/ML IJ SOLN
INTRAMUSCULAR | Status: AC
  Filled 2017-06-19: qty 1

## 2017-06-19 MED ORDER — DIPHENHYDRAMINE HCL 50 MG/ML IJ SOLN: 12.5000 mg | INTRAMUSCULAR | Status: DC | PRN

## 2017-06-19 MED ORDER — CEFAZOLIN SODIUM-DEXTROSE 2-4 GM/100ML-% IV SOLN
2.0000 g | INTRAVENOUS | Status: AC
Start: 2017-06-19 — End: 2017-06-19
  Administered 2017-06-19: 2 g via INTRAVENOUS
  Filled 2017-06-19: qty 100

## 2017-06-19 MED ORDER — SIMETHICONE 80 MG PO CHEW: 80.0000 mg | CHEWABLE_TABLET | ORAL | Status: DC | PRN

## 2017-06-19 MED ORDER — SODIUM CHLORIDE 0.9% FLUSH
3.0000 mL | INTRAVENOUS | Status: DC | PRN
  Administered 2017-06-20: 3 mL via INTRAVENOUS
  Filled 2017-06-19: qty 3

## 2017-06-19 MED ORDER — COCONUT OIL OIL: 1.0000 "application " | TOPICAL_OIL | Status: DC | PRN

## 2017-06-19 MED ORDER — NALBUPHINE HCL 10 MG/ML IJ SOLN: 5.0000 mg | INTRAMUSCULAR | Status: DC | PRN

## 2017-06-19 MED ORDER — SODIUM CHLORIDE 0.9 % IR SOLN
Status: DC | PRN
  Administered 2017-06-19 (×3): 1

## 2017-06-19 MED ORDER — BUPIVACAINE HCL (PF) 0.25 % IJ SOLN
INTRAMUSCULAR | Status: DC | PRN
  Administered 2017-06-19: 20 mL

## 2017-06-19 MED ORDER — MEPERIDINE HCL 25 MG/ML IJ SOLN: 6.2500 mg | INTRAMUSCULAR | Status: DC | PRN

## 2017-06-19 MED ORDER — KETOROLAC TROMETHAMINE 30 MG/ML IJ SOLN
30.0000 mg | Freq: Four times a day (QID) | INTRAMUSCULAR | Status: AC | PRN
Start: 2017-06-19 — End: 2017-06-20

## 2017-06-19 MED ORDER — DIPHENHYDRAMINE HCL 25 MG PO CAPS: 25.0000 mg | ORAL_CAPSULE | Freq: Four times a day (QID) | ORAL | Status: DC | PRN

## 2017-06-19 MED ORDER — FENTANYL CITRATE (PF) 100 MCG/2ML IJ SOLN
INTRAMUSCULAR | Status: AC
  Filled 2017-06-19: qty 2

## 2017-06-19 MED ORDER — OXYCODONE-ACETAMINOPHEN 5-325 MG PO TABS: 2.0000 | ORAL_TABLET | ORAL | Status: DC | PRN

## 2017-06-19 MED ORDER — DEXAMETHASONE SODIUM PHOSPHATE 4 MG/ML IJ SOLN
INTRAMUSCULAR | Status: DC | PRN
  Administered 2017-06-19: 4 mg via INTRAVENOUS

## 2017-06-19 MED ORDER — NALBUPHINE HCL 10 MG/ML IJ SOLN
5.0000 mg | Freq: Once | INTRAMUSCULAR | Status: DC | PRN
Start: 2017-06-19 — End: 2017-06-21

## 2017-06-19 MED ORDER — PROMETHAZINE HCL 25 MG/ML IJ SOLN
6.2500 mg | INTRAMUSCULAR | Status: DC | PRN
  Administered 2017-06-19: 6.25 mg via INTRAVENOUS

## 2017-06-19 MED ORDER — ONDANSETRON HCL 4 MG/2ML IJ SOLN
INTRAMUSCULAR | Status: DC | PRN
  Administered 2017-06-19: 4 mg via INTRAVENOUS

## 2017-06-19 MED ORDER — OXYTOCIN 40 UNITS IN LACTATED RINGERS INFUSION - SIMPLE MED: 2.5000 [IU]/h | INTRAVENOUS | Status: AC

## 2017-06-19 MED ORDER — LACTATED RINGERS IV SOLN: INTRAVENOUS | Status: DC

## 2017-06-19 MED ORDER — ONDANSETRON HCL 4 MG/2ML IJ SOLN: 4.0000 mg | Freq: Three times a day (TID) | INTRAMUSCULAR | Status: DC | PRN

## 2017-06-19 MED ORDER — ZOLPIDEM TARTRATE 5 MG PO TABS: 5.0000 mg | ORAL_TABLET | Freq: Every evening | ORAL | Status: DC | PRN

## 2017-06-19 MED ORDER — MORPHINE SULFATE (PF) 0.5 MG/ML IJ SOLN
INTRAMUSCULAR | Status: AC
  Filled 2017-06-19: qty 10

## 2017-06-19 MED ORDER — BUPIVACAINE IN DEXTROSE 0.75-8.25 % IT SOLN
INTRATHECAL | Status: DC | PRN
  Administered 2017-06-19: 1.4 mL via INTRATHECAL

## 2017-06-19 MED ORDER — FAMOTIDINE 20 MG PO TABS
20.0000 mg | ORAL_TABLET | Freq: Once | ORAL | Status: AC
  Administered 2017-06-19: 20 mg via ORAL
  Filled 2017-06-19: qty 1

## 2017-06-19 MED ORDER — NALOXONE HCL 0.4 MG/ML IJ SOLN: 0.4000 mg | INTRAMUSCULAR | Status: DC | PRN

## 2017-06-19 MED ORDER — FENTANYL CITRATE (PF) 100 MCG/2ML IJ SOLN
INTRAMUSCULAR | Status: DC | PRN
Start: 2017-06-19 — End: 2017-06-19
  Administered 2017-06-19: 10 ug via INTRATHECAL

## 2017-06-19 MED ORDER — METHYLERGONOVINE MALEATE 0.2 MG/ML IJ SOLN: 0.2000 mg | INTRAMUSCULAR | Status: DC | PRN

## 2017-06-19 MED ORDER — NALBUPHINE HCL 10 MG/ML IJ SOLN: 5.0000 mg | Freq: Once | INTRAMUSCULAR | Status: DC | PRN

## 2017-06-19 MED ORDER — WITCH HAZEL-GLYCERIN EX PADS: 1.0000 "application " | MEDICATED_PAD | CUTANEOUS | Status: DC | PRN

## 2017-06-19 MED ORDER — TETANUS-DIPHTH-ACELL PERTUSSIS 5-2.5-18.5 LF-MCG/0.5 IM SUSP: 0.5000 mL | Freq: Once | INTRAMUSCULAR | Status: DC

## 2017-06-19 MED ORDER — LACTATED RINGERS IV SOLN
INTRAVENOUS | Status: DC
  Administered 2017-06-19 (×3): via INTRAVENOUS

## 2017-06-19 MED ORDER — FENTANYL CITRATE (PF) 100 MCG/2ML IJ SOLN
25.0000 ug | INTRAMUSCULAR | Status: DC | PRN
  Administered 2017-06-19 (×2): 25 ug via INTRAVENOUS

## 2017-06-19 MED ORDER — SIMETHICONE 80 MG PO CHEW
80.0000 mg | CHEWABLE_TABLET | ORAL | Status: DC
  Administered 2017-06-19 – 2017-06-20 (×2): 80 mg via ORAL
  Filled 2017-06-19 (×2): qty 1

## 2017-06-19 MED ORDER — MENTHOL 3 MG MT LOZG: 1.0000 | LOZENGE | OROMUCOSAL | Status: DC | PRN

## 2017-06-19 MED ORDER — LACTATED RINGERS IV SOLN
INTRAVENOUS | Status: DC | PRN
  Administered 2017-06-19: 12:00:00 via INTRAVENOUS

## 2017-06-19 MED ORDER — NALOXONE HCL 2 MG/2ML IJ SOSY
1.0000 ug/kg/h | PREFILLED_SYRINGE | INTRAVENOUS | Status: DC | PRN
  Filled 2017-06-19: qty 2

## 2017-06-19 MED ORDER — IBUPROFEN 600 MG PO TABS
600.0000 mg | ORAL_TABLET | Freq: Four times a day (QID) | ORAL | Status: DC
  Filled 2017-06-19: qty 1

## 2017-06-19 MED ORDER — DIPHENHYDRAMINE HCL 25 MG PO CAPS: 25.0000 mg | ORAL_CAPSULE | ORAL | Status: DC | PRN

## 2017-06-19 MED ORDER — SCOPOLAMINE 1 MG/3DAYS TD PT72
1.0000 | MEDICATED_PATCH | Freq: Once | TRANSDERMAL | Status: DC
  Administered 2017-06-19: 1.5 mg via TRANSDERMAL
  Filled 2017-06-19: qty 1

## 2017-06-19 MED ORDER — ACETAMINOPHEN 325 MG PO TABS: 650.0000 mg | ORAL_TABLET | ORAL | Status: DC | PRN

## 2017-06-19 MED ORDER — FERROUS SULFATE 325 (65 FE) MG PO TABS
325.0000 mg | ORAL_TABLET | Freq: Two times a day (BID) | ORAL | Status: DC
  Filled 2017-06-19: qty 1

## 2017-06-19 MED ORDER — SENNOSIDES-DOCUSATE SODIUM 8.6-50 MG PO TABS
2.0000 | ORAL_TABLET | ORAL | Status: DC
  Filled 2017-06-19 (×2): qty 2

## 2017-06-19 MED ORDER — PRENATAL MULTIVITAMIN CH: 1.0000 | ORAL_TABLET | Freq: Every day | ORAL | Status: DC

## 2017-06-19 MED ORDER — NALBUPHINE HCL 10 MG/ML IJ SOLN
5.0000 mg | INTRAMUSCULAR | Status: DC | PRN
  Administered 2017-06-19: 5 mg via SUBCUTANEOUS

## 2017-06-19 MED ORDER — ONDANSETRON HCL 4 MG/2ML IJ SOLN
INTRAMUSCULAR | Status: AC
  Filled 2017-06-19: qty 2

## 2017-06-19 MED ORDER — BUPIVACAINE HCL (PF) 0.25 % IJ SOLN
INTRAMUSCULAR | Status: AC
  Filled 2017-06-19: qty 30

## 2017-06-19 MED ORDER — PHENYLEPHRINE 8 MG IN D5W 100 ML (0.08MG/ML) PREMIX OPTIME
INJECTION | INTRAVENOUS | Status: DC | PRN
  Administered 2017-06-19: 30 ug/min via INTRAVENOUS

## 2017-06-19 MED ORDER — OXYTOCIN 10 UNIT/ML IJ SOLN
INTRAMUSCULAR | Status: AC
  Filled 2017-06-19: qty 4

## 2017-06-19 MED ORDER — MORPHINE SULFATE (PF) 0.5 MG/ML IJ SOLN
INTRAMUSCULAR | Status: DC | PRN
  Administered 2017-06-19: .2 mg via INTRATHECAL

## 2017-06-19 MED ORDER — SOD CITRATE-CITRIC ACID 500-334 MG/5ML PO SOLN
30.0000 mL | Freq: Once | ORAL | Status: AC
  Administered 2017-06-19: 30 mL via ORAL
  Filled 2017-06-19: qty 15

## 2017-06-19 MED ORDER — DIBUCAINE 1 % RE OINT: 1.0000 "application " | TOPICAL_OINTMENT | RECTAL | Status: DC | PRN

## 2017-06-19 MED ORDER — ACETAMINOPHEN 10 MG/ML IV SOLN: 1000.0000 mg | Freq: Once | INTRAVENOUS | Status: DC | PRN

## 2017-06-19 MED ORDER — OXYCODONE-ACETAMINOPHEN 5-325 MG PO TABS: 1.0000 | ORAL_TABLET | ORAL | Status: DC | PRN

## 2017-06-19 MED ORDER — PROMETHAZINE HCL 25 MG/ML IJ SOLN
INTRAMUSCULAR | Status: AC
  Filled 2017-06-19: qty 1

## 2017-06-19 MED ORDER — MEASLES, MUMPS & RUBELLA VAC ~~LOC~~ INJ
0.5000 mL | INJECTION | Freq: Once | SUBCUTANEOUS | Status: DC
  Filled 2017-06-19: qty 0.5

## 2017-06-19 MED ORDER — METHYLERGONOVINE MALEATE 0.2 MG PO TABS: 0.2000 mg | ORAL_TABLET | ORAL | Status: DC | PRN

## 2017-06-19 MED ORDER — OXYTOCIN 10 UNIT/ML IJ SOLN
INTRAVENOUS | Status: DC | PRN
  Administered 2017-06-19: 40 [IU] via INTRAVENOUS

## 2017-06-19 SURGICAL SUPPLY — 41 items
BARRIER ADHS 3X4 INTERCEED (GAUZE/BANDAGES/DRESSINGS) ×3 IMPLANT
BENZOIN TINCTURE PRP APPL 2/3 (GAUZE/BANDAGES/DRESSINGS) ×3 IMPLANT
BOOTIES KNEE HIGH SLOAN (MISCELLANEOUS) ×6 IMPLANT
CHLORAPREP W/TINT 26ML (MISCELLANEOUS) ×3 IMPLANT
CLAMP CORD UMBIL (MISCELLANEOUS) IMPLANT
CLOSURE STERI STRIP 1/2 X4 (GAUZE/BANDAGES/DRESSINGS) ×2 IMPLANT
CLOSURE WOUND 1/2 X4 (GAUZE/BANDAGES/DRESSINGS) ×1
CLOTH BEACON ORANGE TIMEOUT ST (SAFETY) ×3 IMPLANT
DRAIN JACKSON PRT FLT 10 (DRAIN) IMPLANT
DRSG OPSITE POSTOP 4X10 (GAUZE/BANDAGES/DRESSINGS) ×3 IMPLANT
ELECT REM PT RETURN 9FT ADLT (ELECTROSURGICAL) ×3
ELECTRODE REM PT RTRN 9FT ADLT (ELECTROSURGICAL) ×1 IMPLANT
EVACUATOR SILICONE 100CC (DRAIN) IMPLANT
EXTRACTOR VACUUM M CUP 4 TUBE (SUCTIONS) IMPLANT
EXTRACTOR VACUUM M CUP 4' TUBE (SUCTIONS)
GLOVE BIOGEL PI IND STRL 7.0 (GLOVE) ×2 IMPLANT
GLOVE BIOGEL PI INDICATOR 7.0 (GLOVE) ×4
GLOVE ECLIPSE 6.5 STRL STRAW (GLOVE) ×3 IMPLANT
GOWN STRL REUS W/TWL LRG LVL3 (GOWN DISPOSABLE) ×6 IMPLANT
KIT ABG SYR 3ML LUER SLIP (SYRINGE) IMPLANT
NEEDLE HYPO 22GX1.5 SAFETY (NEEDLE) ×6 IMPLANT
NEEDLE HYPO 25X5/8 SAFETYGLIDE (NEEDLE) IMPLANT
NS IRRIG 1000ML POUR BTL (IV SOLUTION) ×6 IMPLANT
PACK C SECTION WH (CUSTOM PROCEDURE TRAY) ×3 IMPLANT
PAD ABD 7.5X8 STRL (GAUZE/BANDAGES/DRESSINGS) ×3 IMPLANT
PAD OB MATERNITY 4.3X12.25 (PERSONAL CARE ITEMS) ×3 IMPLANT
PENCIL SMOKE EVAC W/HOLSTER (ELECTROSURGICAL) ×3 IMPLANT
RTRCTR C-SECT PINK 25CM LRG (MISCELLANEOUS) ×3 IMPLANT
SPONGE GAUZE 4X4 12PLY STER LF (GAUZE/BANDAGES/DRESSINGS) ×3 IMPLANT
STRIP CLOSURE SKIN 1/2X4 (GAUZE/BANDAGES/DRESSINGS) ×2 IMPLANT
SUT MNCRL AB 3-0 PS2 27 (SUTURE) ×3 IMPLANT
SUT PLAIN 0 NONE (SUTURE) ×3 IMPLANT
SUT SILK 2 0 FSL 18 (SUTURE) IMPLANT
SUT VIC AB 0 CTX 36 (SUTURE) ×10
SUT VIC AB 0 CTX36XBRD ANBCTRL (SUTURE) ×5 IMPLANT
SUT VIC AB 1 CT1 36 (SUTURE) ×6 IMPLANT
SUT VIC AB 2-0 CT1 27 (SUTURE)
SUT VIC AB 2-0 CT1 TAPERPNT 27 (SUTURE) IMPLANT
SYR 20CC LL (SYRINGE) ×3 IMPLANT
TOWEL OR 17X24 6PK STRL BLUE (TOWEL DISPOSABLE) ×3 IMPLANT
TRAY FOLEY BAG SILVER LF 14FR (SET/KITS/TRAYS/PACK) ×3 IMPLANT

## 2017-06-19 NOTE — Op Note (Signed)
Preoperative diagnosis: Intrauterine pregnancy at 39 weeks and 3 days with 2 previous cesarean sections  Post operative diagnosis: Same  Anesthesia: Spinal  Anesthesiologist: Dr. Bradley FerrisEllender  Procedure: Repeat low transverse cesarean section  Surgeon: Dr. Dois DavenportSandra Samon Dishner  Assistant: Harley HallmarkHeather Kritenmeyer RNFA  Estimated blood loss: 600 cc  Procedure:  After being informed of the planned procedure and possible complications including bleeding, infection, injury to other organs, informed consent is obtained. The patient is taken to OR #9 and given spinal anesthesia without complication. She is placed in the dorsal decubitus position with the pelvis tilted to the left. She is then prepped and draped in a sterile fashion. A Foley catheter is inserted in her bladder.  After assessing adequate level of anesthesia, we infiltrate the suprapubic area with 20 cc of Marcaine 0.25 and perform a Pfannenstiel incision which is brought down sharply to the fascia. The fascia is entered in a low transverse fashion. Linea alba is dissected.Multiple dense adhesions between the omentum and the abdominal wall as well as the uterine body are divided with double clamping. sectioning and free tying with 0 Plain sutures.  Peritoneum is then entered in a midline fashion. An Alexis retractor is easily positioned.   The myometrium is then entered in a low transverse fashion, 2 cm above the vesico-uterine junction ; first with knife and then extended bluntly. Amniotic fluid is clear. We assist the birth of a female  infant in vertex presentation. Mouth and nose are suctioned. The baby is delivered. The cord is clamped and sectioned at 1 minute of life. The baby is given to the neonatologist present in the room.  10 cc of blood is drawn from the umbilical vein.The placenta is allowed to deliver spontaneously. It is complete and the cord has 3 vessels. Uterine revision is negative.  We proceed with closure of the myometrium in 2  layers: First with a running locked suture of 0 Vicryl, then with a Lembert suture of 0 Vicryl imbricating the first one. Hemostasis is completed with cauterization on peritoneal edges and 3 figure-of-eight 0-Vicryl sutures.  Both paracolic gutters are cleaned. Both tubes and ovaries are assessed and normal. The pelvis is profusely irrigated with warm saline to confirm a satisfactory hemostasis.  Retractors and sponges are removed. Under fascia hemostasis is completed with cauterization. Interceed is placed on the uterus. The fascia is then closed with 2 running sutures of 0 Vicryl meeting midline. The wound is irrigated with warm saline and hemostasis is completed with cauterization. The skin is closed with a subcuticular suture of 3-0 Monocryl and Steri-Strips.  Instrument and sponge count is complete x2. Estimated blood loss is 600 cc.  The procedure is well tolerated by the patient who is taken to recovery room in a well and stable condition.  female baby named Corene CorneaMarquell was born at 11:43 and received an Apgar of 7  at 1 minute and 9 at 5 minutes.    Specimen: Placenta sent to L & D   Garron Eline A MD 6/20/201810:52 AM

## 2017-06-19 NOTE — Interval H&P Note (Signed)
History and Physical Interval Note:  06/19/2017 10:48 AM  [name]  has presented today for surgery, with the diagnosis of Prior Cesarean Section  The various methods of treatment have been discussed with the patient and family. After consideration of risks, benefits and other options for treatment, the patient has consented to  Procedure(s) with comments: Repeat CESAREAN SECTION (N/A) - RNFA IS NEEDED as a surgical intervention .  The patient's history has been reviewed, patient examined, no change in status, stable for surgery.  I have reviewed the patient's chart and labs.  Questions were answered to the patient's satisfaction.     [name]

## 2017-06-19 NOTE — Anesthesia Postprocedure Evaluation (Signed)
Anesthesia Post Note  Patient: [name]  Procedure(s) Performed: Procedure(s) (LRB): Repeat CESAREAN SECTION (N/A)     Patient location during evaluation: PACU Anesthesia Type: Spinal Level of consciousness: oriented and awake and alert Pain management: pain level controlled Vital Signs Assessment: post-procedure vital signs reviewed and stable Respiratory status: spontaneous breathing, respiratory function stable and patient connected to nasal cannula oxygen Cardiovascular status: blood pressure returned to baseline and stable Postop Assessment: no headache and no backache Anesthetic complications: no    Last Vitals:  Vitals:   06/19/17 1520 06/19/17 1623  BP: 116/75 124/69  Pulse: 68 87  Resp: 16 16  Temp: 36.7 C     Last Pain:  Vitals:   06/19/17 1623  TempSrc:   PainSc: 0-No pain   Pain Goal: Patients Stated Pain Goal: 0 (06/19/17 1330)               Brigetta Beckstrom P Amadeus Oyama

## 2017-06-19 NOTE — Anesthesia Preprocedure Evaluation (Addendum)
Anesthesia Evaluation  Patient identified by MRN, date of birth, ID band Patient awake    Reviewed: Allergy & Precautions, H&P , NPO status , Patient's Chart, lab work & pertinent test results  Airway Mallampati: II  TM Distance: >3 FB Neck ROM: full    Dental no notable dental hx. (+) Teeth Intact   Pulmonary neg pulmonary ROS,    Pulmonary exam normal breath sounds clear to auscultation       Cardiovascular negative cardio ROS   Rhythm:regular Rate:Normal     Neuro/Psych negative neurological ROS  negative psych ROS   GI/Hepatic negative GI ROS, Neg liver ROS,   Endo/Other  negative endocrine ROS  Renal/GU negative Renal ROS  negative genitourinary   Musculoskeletal   Abdominal Normal abdominal exam  (+)   Peds  Hematology  (+) anemia ,   Anesthesia Other Findings   Reproductive/Obstetrics (+) Pregnancy                            Anesthesia Physical  Anesthesia Plan  ASA: II  Anesthesia Plan: Spinal   Post-op Pain Management:    Induction:   PONV Risk Score and Plan: 2 and Ondansetron and Treatment may vary due to age or medical condition  Airway Management Planned:   Additional Equipment:   Intra-op Plan:   Post-operative Plan:   Informed Consent: I have reviewed the patients History and Physical, chart, labs and discussed the procedure including the risks, benefits and alternatives for the proposed anesthesia with the patient or authorized representative who has indicated his/her understanding and acceptance.     Plan Discussed with: CRNA  Anesthesia Plan Comments:         Anesthesia Quick Evaluation

## 2017-06-19 NOTE — Transfer of Care (Signed)
Immediate Anesthesia Transfer of Care Note  Patient: [name]  Procedure(s) Performed: Procedure(s) with comments: Repeat CESAREAN SECTION (N/A) - RNFA IS NEEDED  Patient Location: PACU  Anesthesia Type:Spinal  Level of Consciousness: awake, alert , oriented and patient cooperative  Airway & Oxygen Therapy: Patient Spontanous Breathing  Post-op Assessment: Report given to RN  Post vital signs: Reviewed and stable  Last Vitals:  Vitals:   06/19/17 0931 06/19/17 0937  BP: 114/71   Pulse: 89   Resp: 18   Temp: 36.9 C 36.9 C    Last Pain:  Vitals:   06/19/17 0931  TempSrc: Oral      Patients Stated Pain Goal: 4 (06/19/17 0931)  Complications: No apparent anesthesia complications

## 2017-06-19 NOTE — Anesthesia Procedure Notes (Signed)
Spinal  Patient location during procedure: OR Start time: 06/19/2017 11:00 AM End time: 06/19/2017 11:10 AM Staffing Anesthesiologist: Karna ChristmasELLENDER, Less Woolsey P Performed: anesthesiologist  Preanesthetic Checklist Completed: patient identified, surgical consent, pre-op evaluation, timeout performed, IV checked, risks and benefits discussed and monitors and equipment checked Spinal Block Patient position: sitting Prep: DuraPrep Patient monitoring: cardiac monitor, continuous pulse ox and blood pressure Approach: midline Location: L4-5 Injection technique: single-shot Needle Needle type: Pencan  Needle gauge: 24 G Needle length: 9 cm Assessment Sensory level: T10 Additional Notes Functioning IV was confirmed and monitors were applied. Sterile prep and drape, including hand hygiene and sterile gloves were used. The patient was positioned and the spine was prepped. The skin was anesthetized with lidocaine.  Free flow of clear CSF was obtained prior to injecting local anesthetic into the CSF.  The spinal needle aspirated freely following injection.  The needle was carefully withdrawn.  The patient tolerated the procedure well.

## 2017-06-20 LAB — CBC
HCT: 26.9 % — ABNORMAL LOW (ref 36.0–46.0)
HEMOGLOBIN: 8.9 g/dL — AB (ref 12.0–15.0)
MCH: 30.1 pg (ref 26.0–34.0)
MCHC: 33.1 g/dL (ref 30.0–36.0)
MCV: 90.9 fL (ref 78.0–100.0)
Platelets: 134 10*3/uL — ABNORMAL LOW (ref 150–400)
RBC: 2.96 MIL/uL — ABNORMAL LOW (ref 3.87–5.11)
RDW: 14.6 % (ref 11.5–15.5)
WBC: 10.5 10*3/uL (ref 4.0–10.5)

## 2017-06-20 MED ORDER — FERROUS SULFATE 300 (60 FE) MG/5ML PO SYRP
300.0000 mg | ORAL_SOLUTION | Freq: Two times a day (BID) | ORAL | Status: DC
  Administered 2017-06-20 – 2017-06-21 (×2): 300 mg via ORAL
  Filled 2017-06-20 (×2): qty 5

## 2017-06-20 MED ORDER — OXYCODONE HCL 5 MG/5ML PO SOLN: 5.0000 mg | ORAL | Status: DC | PRN

## 2017-06-20 MED ORDER — COMPLETENATE 29-1 MG PO CHEW
1.0000 | CHEWABLE_TABLET | Freq: Every day | ORAL | Status: DC
  Administered 2017-06-20: 1 via ORAL
  Filled 2017-06-20 (×2): qty 1

## 2017-06-20 MED ORDER — DOCUSATE SODIUM 50 MG/5ML PO LIQD
100.0000 mg | Freq: Two times a day (BID) | ORAL | Status: DC
  Administered 2017-06-20 (×2): 100 mg via ORAL
  Filled 2017-06-20 (×3): qty 10

## 2017-06-20 MED ORDER — IBUPROFEN 100 MG/5ML PO SUSP
600.0000 mg | Freq: Four times a day (QID) | ORAL | Status: DC
  Administered 2017-06-20 (×2): 600 mg via ORAL
  Administered 2017-06-20: 20 mg via ORAL
  Administered 2017-06-20 – 2017-06-21 (×3): 600 mg via ORAL
  Filled 2017-06-20 (×7): qty 30

## 2017-06-20 MED ORDER — ACETAMINOPHEN 160 MG/5ML PO SOLN: 325.0000 mg | ORAL | Status: DC | PRN

## 2017-06-20 NOTE — Plan of Care (Signed)
Problem: Activity: Goal: Ability to tolerate increased activity will improve Outcome: Completed/Met Date Met: 06/20/17 Did patients orthostatic vital signs prior to ambulating and they were WNL. We ambulated to the bathroom without any dizziness or significant pain.

## 2017-06-20 NOTE — Progress Notes (Signed)
Patient informed that Lovenox has been ordered for prevention of DVT based on BMI. Patient declined medication and states she will continue to ambulate frequently in room and hallway.

## 2017-06-20 NOTE — Addendum Note (Signed)
Addendum  created 06/20/17 0742 by Earmon PhoenixWilkerson, Ahja Martello P, CRNA   Sign clinical note

## 2017-06-20 NOTE — Progress Notes (Signed)
Patient is inquiring why she hs been ordered Lovenox. Med has been withheld until MD/ CNM is contacted for clarification of med.

## 2017-06-20 NOTE — Anesthesia Postprocedure Evaluation (Signed)
Anesthesia Post Note  Patient: [name]  Procedure(s) Performed: Procedure(s) (LRB): Repeat CESAREAN SECTION (N/A)     Patient location during evaluation: Mother Baby Anesthesia Type: Spinal Level of consciousness: awake and awake and alert Pain management: pain level controlled Vital Signs Assessment: post-procedure vital signs reviewed and stable Respiratory status: spontaneous breathing and nonlabored ventilation Cardiovascular status: stable Postop Assessment: no headache, no backache, spinal receding, patient able to bend at knees and no signs of nausea or vomiting Anesthetic complications: no    Last Vitals:  Vitals:   06/20/17 0125 06/20/17 0535  BP: (!) 102/52 (!) 94/57  Pulse: 85 67  Resp: 18 18  Temp: 36.9 C 36.8 C    Last Pain:  Vitals:   06/20/17 0535  TempSrc: Oral  PainSc: 0-No pain   Pain Goal: Patients Stated Pain Goal: 0 (06/19/17 1330)               Edison PaceWILKERSON,Jesaiah Fabiano

## 2017-06-20 NOTE — Progress Notes (Signed)
2Subjective: Postpartum Day 1: Cesarean Delivery repeat x 3. Patient reports tolerating PO, + flatus and no problems voiding.    Objective: Vital signs in last 24 hours: Temp:  [97.6 F (36.4 C)-98.5 F (36.9 C)] 98.2 F (36.8 C) (06/21 0535) Pulse Rate:  [67-95] 67 (06/21 0535) Resp:  [16-21] 18 (06/21 0535) BP: (94-131)/(52-99) 94/57 (06/21 0535) SpO2:  [96 %-100 %] 99 % (06/21 0535)  Physical Exam:  General: alert, cooperative and no distress Lochia: appropriate Uterine Fundus: firm Incision: coverlet dry DVT Evaluation: No evidence of DVT seen on physical exam. Negative Homan's sign.   Recent Labs  06/18/17 1105 06/19/17 1510  HGB 10.7* 10.7*  HCT 32.3* 32.0*    Assessment/Plan: Status post Cesarean section. Doing well postoperatively.  Continue current care.  Plan on discharge tomorrow.  [name] 06/20/2017, 6:32 AM

## 2017-06-21 MED ORDER — IBUPROFEN 100 MG/5ML PO SUSP: 600.0000 mg | Freq: Four times a day (QID) | ORAL | 1 refills | Status: DC

## 2017-06-21 MED ORDER — MEDROXYPROGESTERONE ACETATE 150 MG/ML IM SUSP
150.0000 mg | Freq: Once | INTRAMUSCULAR | Status: AC
  Administered 2017-06-21: 150 mg via INTRAMUSCULAR
  Filled 2017-06-21: qty 1

## 2017-06-21 NOTE — Discharge Instructions (Signed)

## 2017-06-21 NOTE — Discharge Summary (Signed)
OB Discharge Summary     Patient Name: [name] DOB: 05/23/1981 MRN: [id number]  Date of admission: 06/19/2017 Delivering MD: Silverio Lay   Date of discharge: 06/21/2017  Admitting diagnosis: Prior Cesarean Section Intrauterine pregnancy: [redacted]w[redacted]d     Secondary diagnosis:  Principal Problem:   Cesarean delivery delivered Active Problems:   Anemia   AMA (advanced maternal age) multigravida 35+, second trimester  Additional problems: None     Discharge diagnosis: Term Pregnancy Delivered, Anemia and repeat cesarean                                                                                                Post partum procedures:None  Augmentation: N/A  Complications: None  Hospital course:  Sceduled C/S   36 y.o. yo [id number] at [redacted]w[redacted]d was admitted to the hospital 06/19/2017 for scheduled cesarean section with the following indication:Elective Repeat. Hx of 2 previous C/S. Membrane Rupture Time/Date: 11:43 AM ,06/19/2017   Patient delivered a Viable infant.06/19/2017  Details of operation can be found in separate operative note.  Pateint had an uncomplicated postpartum course.  She is ambulating, tolerating a regular diet, passing flatus, and urinating well. Patient is discharged home in stable condition on  06/21/17. Family plans outpatient circumcision.  Patient desired Depo prior to d/c.  Patient was receiving adequate pain relief from Motrin, declined Percocet. She had also declined Lovenox during pp stay--had been ordered due to BMI for prophylaxis.         Physical exam  Vitals:   06/20/17 0920 06/20/17 1217 06/20/17 1819 06/21/17 0532  BP: (!) 115/53 (!) 107/58 100/61 106/61  Pulse: 75 82 92 80  Resp: 18 18 18 18   Temp: 98 F (36.7 C) 98.7 F (37.1 C) 99.4 F (37.4 C) 98.6 F (37 C)  TempSrc: Oral Oral Oral Oral  SpO2: 99%      General: alert Lochia: appropriate Uterine Fundus: firm Incision: Dressing is clean, dry, and intact DVT Evaluation: No  evidence of DVT seen on physical exam. Negative Homan's sign. Labs: CBC Latest Ref Rng & Units 06/20/2017 06/19/2017 06/18/2017  WBC 4.0 - 10.5 K/uL 10.5 13.8(H) 8.2  Hemoglobin 12.0 - 15.0 g/dL 1.4(N) 10.7(L) 10.7(L)  Hematocrit 36.0 - 46.0 % 26.9(L) 32.0(L) 32.3(L)  Platelets 150 - 400 K/uL 134(L) 142(L) 136(L)   CMP Latest Ref Rng & Units 06/19/2017  Glucose 70 - 99 mg/dL -  BUN 6 - 23 mg/dL -  Creatinine 8.29 - 5.62 mg/dL 1.30  Sodium 865 - 784 mEq/L -  Potassium 3.5 - 5.1 mEq/L -  Chloride 96 - 112 mEq/L -  CO2 19 - 32 mEq/L -  Calcium 8.4 - 10.5 mg/dL -  Total Protein 6.0 - 8.3 g/dL -  Total Bilirubin 0.3 - 1.2 mg/dL -  Alkaline Phos 39 - 696 U/L -  AST 0 - 37 U/L -  ALT 0 - 35 U/L -    Discharge instruction: per After Visit Summary and "Baby and Me Booklet".  After visit meds:  Allergies as of 06/21/2017      Reactions   Carrot [  daucus Carota]    Shellfish Allergy    Hives, shrimp and lobster   Watermelon [citrullus Vulgaris] Hives      Medication List    TAKE these medications   ibuprofen 100 MG/5ML suspension Commonly known as:  ADVIL,MOTRIN Take 30 mLs (600 mg total) by mouth every 6 (six) hours.   IRON FORMULA PO Take 10 mLs by mouth daily.   multivitamin-prenatal 27-0.8 MG Tabs tablet Take 1 tablet by mouth daily at 12 noon.       Diet: routine diet  Activity: Advance as tolerated. Pelvic rest for 6 weeks.   Outpatient follow up:6 weeks Follow up Appt:No future appointments. Follow up Visit:No Follow-up on file.  Postpartum contraception: Depo Provera on d/c  Newborn Data: Live born female  Birth Weight: 6 lb 13.5 oz (3105 g) APGAR: 7, 9  Baby Feeding: Bottle Disposition:home with mother   06/21/2017 Nigel BridgemanLATHAM, Omere Marti, CNM

## 2017-11-25 ENCOUNTER — Ambulatory Visit: Payer: BC Managed Care – PPO | Admitting: Family Medicine

## 2017-11-25 ENCOUNTER — Other Ambulatory Visit: Payer: Self-pay

## 2017-11-25 ENCOUNTER — Encounter: Payer: Self-pay | Admitting: Family Medicine

## 2017-11-25 VITALS — BP 112/76 | HR 77 | Temp 98.9°F | Resp 16 | Ht 62.0 in | Wt 155.8 lb

## 2017-11-25 DIAGNOSIS — R5381 Other malaise: Secondary | ICD-10-CM | POA: Diagnosis not present

## 2017-11-25 DIAGNOSIS — H9202 Otalgia, left ear: Secondary | ICD-10-CM

## 2017-11-25 DIAGNOSIS — J020 Streptococcal pharyngitis: Secondary | ICD-10-CM

## 2017-11-25 DIAGNOSIS — J029 Acute pharyngitis, unspecified: Secondary | ICD-10-CM | POA: Diagnosis not present

## 2017-11-25 LAB — POCT RAPID STREP A (OFFICE): RAPID STREP A SCREEN: POSITIVE — AB

## 2017-11-25 MED ORDER — CEPHALEXIN 250 MG/5ML PO SUSR
500.0000 mg | Freq: Two times a day (BID) | ORAL | 0 refills | Status: AC
Start: 2017-11-25 — End: 2017-12-05

## 2017-11-25 NOTE — Progress Notes (Signed)
Patient ID: Ventura Sellersisha M Mcwhirt, female    DOB: 10/18/1981  Age: 36 y.o. MRN: [id number]  Chief Complaint  Patient presents with  . Sore Throat    headache, body aches, left ear pain, diarrhea, vomiting yesterday chest hurts x 2 days     Subjective:   36 year old lady with a history of feeling ill for a couple of days. She was initially sick day before yesterday with nausea and vomiting and diarrhea. That subsided. She developed a sore throat. She has generalized malaise with body aches and headache and left ear pain. No documented fevers. 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 She has 3 children, but none of them up in ill. She does not smoke. She is generally a healthy lady.  Current allergies, medications, problem list, past/family and social histories reviewed.  Objective:  BP 112/76   Pulse 77   Temp 98.9 F (37.2 C)   Resp 16   Ht 5\' 2"  (1.575 m)   Wt 155 lb 12.8 oz (70.7 kg)   SpO2 98%   BMI 28.50 kg/m   No major acute distress. TMs are normal. Throat erythematous and a little edematous without any exudate. Neck supple but has a little tenderness of the left submandibular glands. Chest is clear to auscultation. Heart regular without any murmurs.  Assessment & Plan:   Assessment: 1. Streptococcal pharyngitis   2. Acute pharyngitis, unspecified etiology   3. Otalgia of left ear   4. Malaise       Plan: Will check the strep test. This could be just a viral infection or it could be strep.  Orders Placed This Encounter  Procedures  . POCT rapid strep A    Meds ordered this encounter  Medications  . cephALEXin (KEFLEX) 250 MG/5ML suspension    Sig: Take 10 mLs (500 mg total) by mouth 2 (two) times daily for 10 days.    Dispense:  200 mL    Refill:  0         Patient Instructions   Drink plenty of fluids and get enough rest  Take Tylenol or ibuprofen as needed for fever or body aches  Take the cephalexin 500 mg twice daily  for 10 days (2 teaspoons twice daily = 10 mL twice daily)  Try to avoid sharing the infection with the family  Stay out of work until Wednesday, longer if necessary  Return as needed    IF you received an x-ray today, you will receive an invoice from [facility]. Please contact [facility] at [phone] with questions or concerns regarding your invoice.   IF you received labwork today, you will receive an invoice from [facility]. Please contact [facility] at [phone] with questions or concerns regarding your invoice.   Our billing staff will not be able to assist you with questions regarding bills from these companies.  You will be contacted with the lab results as soon as they are available. The fastest way to get your results is to activate your My Chart account. Instructions are located on the last page of this paperwork. If you have not heard from us regarding the results in 2 weeks, please contact this office.         Return if symptoms worsen or fail to improve.   Shandi Godfrey, MD 11/25/2017

## 2017-11-25 NOTE — Patient Instructions (Addendum)
Drink plenty of fluids and get enough rest  Take Tylenol or ibuprofen as needed for fever or body aches  Take the cephalexin 500 mg twice daily for 10 days (2 teaspoons twice daily = 10 mL twice daily)  Try to avoid sharing the infection with the family  Stay out of work until Wednesday, longer if necessary  Return as needed    IF you received an x-ray today, you will receive an invoice [facility]. Please contact [facility] at [phone] with questions or concerns regarding your invoice.   IF you received labwork today, you will receive an invoice from [facility]. Please contact [facility] at [phone] with questions or concerns regarding your invoice.   Our billing staff will not be able to assist you with questions regarding bills from these companies.  You will be contacted with the lab results as soon as they are available. The fastest way to get your results is to activate your My Chart account. Instructions are located on the last page of this paperwork. If you have not heard from us regarding the results in 2 weeks, please contact this office.

## 2017-11-28 ENCOUNTER — Encounter: Payer: Self-pay | Admitting: *Deleted

## 2018-05-13 ENCOUNTER — Ambulatory Visit: Payer: BC Managed Care – PPO | Admitting: Physician Assistant

## 2018-07-30 ENCOUNTER — Ambulatory Visit (INDEPENDENT_AMBULATORY_CARE_PROVIDER_SITE_OTHER): Payer: BC Managed Care – PPO | Admitting: Family

## 2018-07-30 ENCOUNTER — Encounter: Payer: Self-pay | Admitting: Family

## 2018-07-30 ENCOUNTER — Other Ambulatory Visit (INDEPENDENT_AMBULATORY_CARE_PROVIDER_SITE_OTHER): Payer: BC Managed Care – PPO

## 2018-07-30 VITALS — BP 110/76 | HR 82 | Temp 98.3°F | Ht 62.0 in | Wt 162.1 lb

## 2018-07-30 DIAGNOSIS — Z0000 Encounter for general adult medical examination without abnormal findings: Secondary | ICD-10-CM

## 2018-07-30 DIAGNOSIS — Z13220 Encounter for screening for lipoid disorders: Secondary | ICD-10-CM | POA: Diagnosis not present

## 2018-07-30 DIAGNOSIS — Z23 Encounter for immunization: Secondary | ICD-10-CM

## 2018-07-30 DIAGNOSIS — R5383 Other fatigue: Secondary | ICD-10-CM

## 2018-07-30 LAB — CBC WITH DIFFERENTIAL/PLATELET
BASOS PCT: 0.3 % (ref 0.0–3.0)
Basophils Absolute: 0 10*3/uL (ref 0.0–0.1)
EOS ABS: 0.1 10*3/uL (ref 0.0–0.7)
EOS PCT: 1.6 % (ref 0.0–5.0)
HEMATOCRIT: 39.4 % (ref 36.0–46.0)
HEMOGLOBIN: 13.3 g/dL (ref 12.0–15.0)
LYMPHS PCT: 32.3 % (ref 12.0–46.0)
Lymphs Abs: 2 10*3/uL (ref 0.7–4.0)
MCHC: 33.8 g/dL (ref 30.0–36.0)
MCV: 90 fl (ref 78.0–100.0)
MONOS PCT: 7.6 % (ref 3.0–12.0)
Monocytes Absolute: 0.5 10*3/uL (ref 0.1–1.0)
Neutro Abs: 3.7 10*3/uL (ref 1.4–7.7)
Neutrophils Relative %: 58.2 % (ref 43.0–77.0)
Platelets: 200 10*3/uL (ref 150.0–400.0)
RBC: 4.38 Mil/uL (ref 3.87–5.11)
RDW: 13.3 % (ref 11.5–15.5)
WBC: 6.3 10*3/uL (ref 4.0–10.5)

## 2018-07-30 LAB — LIPID PANEL
CHOL/HDL RATIO: 5
Cholesterol: 210 mg/dL — ABNORMAL HIGH (ref 0–200)
HDL: 45 mg/dL (ref >39.00)
LDL Cholesterol: 145 mg/dL — ABNORMAL HIGH (ref 0–99)
NONHDL: 164.87
Triglycerides: 97 mg/dL (ref 0.0–149.0)
VLDL: 19.4 mg/dL (ref 0.0–40.0)

## 2018-07-30 LAB — COMPREHENSIVE METABOLIC PANEL
ALBUMIN: 4.1 g/dL (ref 3.5–5.2)
ALT: 11 U/L (ref 0–35)
AST: 14 U/L (ref 0–37)
Alkaline Phosphatase: 52 U/L (ref 39–117)
BUN: 10 mg/dL (ref 6–23)
CALCIUM: 9.3 mg/dL (ref 8.4–10.5)
CHLORIDE: 104 meq/L (ref 96–112)
CO2: 30 mEq/L (ref 19–32)
Creatinine, Ser: 0.82 mg/dL (ref 0.40–1.20)
GFR: 101.08 mL/min (ref >60.00)
Glucose, Bld: 95 mg/dL (ref 70–99)
POTASSIUM: 3.8 meq/L (ref 3.5–5.1)
Sodium: 140 mEq/L (ref 135–145)
Total Bilirubin: 0.4 mg/dL (ref 0.2–1.2)
Total Protein: 7.5 g/dL (ref 6.0–8.3)

## 2018-07-30 LAB — TSH: TSH: 0.85 u[IU]/mL (ref 0.35–4.50)

## 2018-07-30 LAB — VITAMIN D 25 HYDROXY (VIT D DEFICIENCY, FRACTURES): VITD: 11.84 ng/mL — ABNORMAL LOW (ref 30.00–100.00)

## 2018-07-30 NOTE — Progress Notes (Signed)
[name] is a 37 y.o. female with the following history as recorded in EpicCare:  Patient Active Problem List   Diagnosis Date Noted  . Cesarean delivery delivered 06/19/2017  . AMA (advanced maternal age) multigravida 45+, second trimester 03/04/2017  . 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 Previous cesarean section x 2 05/01/2012  . Anemia 05/01/2012    Current Outpatient Medications  Medication Sig Dispense Refill  . EPINEPHrine (EPIPEN 2-PAK) 0.3 mg/0.3 mL IJ SOAJ injection EpiPen 2-Pak 0.3 mg/0.3 mL injection, auto-injector    . MedroxyPROGESTERone Acetate 150 MG/ML SUSY INJECT 1 ML EVERY 3 MONTHS BY INTRAMUSCULAR ROUTE.  4   No current facility-administered medications for this visit.     Allergies: Carrot [daucus carota]; Shellfish allergy; and Watermelon [citrullus vulgaris]  Past Medical History:  Diagnosis Date  . Allergy   . Anemia   . Chicken pox   . Pelvic adhesive disease    noted at 2nd c-section op note in Epic.     Past Surgical History:  Procedure Laterality Date  . CESAREAN SECTION    . CESAREAN SECTION  06/18/2012   Procedure: CESAREAN SECTION;  Surgeon: Ena Dawley, MD;  Location: [facility];  Service: Gynecology;  Laterality: N/A;  with delivery of baby boy at 72  . CESAREAN SECTION N/A 06/19/2017   Procedure: Repeat CESAREAN SECTION;  Surgeon: Delsa Bern, MD;  Location: [place];  Service: Obstetrics;  Laterality: N/A;  RNFA IS NEEDED  . DENTAL SURGERY      Family History  Problem Relation Age of Onset  . Cancer Maternal Grandfather   . Prostate cancer Maternal Grandfather   . Hypertension Mother   . Allergic rhinitis Mother   . Arthritis Maternal Grandmother   . Hypertension Maternal Grandmother   . Diabetes Maternal Grandmother   . Allergic rhinitis Father   . Allergic rhinitis Sister   . Food Allergy Sister   . Food Allergy Son   . Angioedema Neg Hx   . Asthma Neg Hx   . Atopy Neg Hx   . Eczema Neg Hx   .  Immunodeficiency Neg Hx   . Urticaria Neg Hx     Social History   Tobacco Use  . Smoking status: Never Smoker  . Smokeless tobacco: Never Used  Substance Use Topics  . 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  Review of Systems  Constitutional: Negative.   HENT: Negative.   Eyes: Negative for blurred vision.  Respiratory: Negative for cough and shortness of breath.   Cardiovascular: Negative for chest pain.  Gastrointestinal: Negative for abdominal pain, constipation and diarrhea.  Genitourinary: Negative.   Musculoskeletal: Negative for joint pain.  Skin: Negative.   Neurological: Negative for dizziness and headaches.  Psychiatric/Behavioral: Negative for depression. The patient is not nervous/anxious and does not have insomnia.      Objective:  Vitals:   07/30/18 0935  BP: 110/76  Pulse: 82  Temp: 98.3 F (36.8 C)  TempSrc: Oral  SpO2: 97%  Weight: 162  lb 1.9 oz (73.5 kg)  Height: 5' 2"  (1.575 m)    General: Well developed, well nourished, in no acute distress  Skin : Warm and dry.  Head: Normocephalic and atraumatic  Eyes: Sclera and conjunctiva clear; pupils round and reactive to light; extraocular movements intact  Ears: External normal; canals clear; tympanic membranes normal  Oropharynx: Pink, supple. No suspicious lesions  Neck: Supple without thyromegaly, adenopathy  Lungs: Respirations unlabored; clear to auscultation bilaterally without wheeze, rales, rhonchi  CVS exam: normal rate and regular rhythm.  Abdomen: Soft; nontender;  nondistended; normoactive bowel sounds; no masses or hepatosplenomegaly  Musculoskeletal: No deformities; no active joint inflammation  Extremities: No edema, cyanosis, clubbing  Vessels: Symmetric bilaterally  Neurologic: Alert and oriented; speech intact; face symmetrical; moves all extremities well; CNII-XII intact without focal deficit   Assessment:  1. PE (physical exam), annual   2. Other fatigue   3. Lipid screening   4. Need for tuberculosis vaccination     Plan:  Age appropriate preventive healthcare needs addressed; encouraged regular eye doctor and dental exams; encouraged regular exercise; will update labs and refills as needed today; follow-up to be determined; PPD placed- follow-up in 48 hours; If labs are normal, discuss weight gain concerns with GYN- ? Related to Depo; follow-up to be determined.      Return in about 2 days (around 08/01/2018) for PPD read/ nurse visit.  Orders Placed This Encounter  Procedures  . CBC w/Diff    Standing Status:   Future    Number of Occurrences:   1    Standing Expiration Date:   07/30/2019  . Comp Met (CMET)    Standing Status:   Future    Number of Occurrences:   1    Standing Expiration Date:   07/30/2019  . TSH    Standing Status:   Future    Number of Occurrences:   1    Standing Expiration Date:   07/30/2019  . Vitamin D (25 hydroxy)    Standing Status:   Future    Number of Occurrences:   1    Standing Expiration Date:   07/30/2019  . Lipid panel    Standing Status:   Future    Number of Occurrences:   1    Standing Expiration Date:   07/31/2019  . TB Skin Test    Order Specific Question:   Has patient ever tested positive?    Answer:   No    Requested Prescriptions    No prescriptions requested or ordered in this encounter

## 2018-07-30 NOTE — Patient Instructions (Signed)

## 2018-07-31 ENCOUNTER — Other Ambulatory Visit: Payer: Self-pay | Admitting: Family

## 2018-07-31 DIAGNOSIS — D509 Iron deficiency anemia, unspecified: Secondary | ICD-10-CM

## 2018-07-31 MED ORDER — VITAMIN D (ERGOCALCIFEROL) 1.25 MG (50000 UNIT) PO CAPS
50000.0000 [IU] | ORAL_CAPSULE | ORAL | 0 refills | Status: AC
Start: 2018-07-31 — End: 2018-10-17

## 2018-08-01 LAB — TB SKIN TEST
INDURATION: 0 mm
TB Skin Test: NEGATIVE

## 2018-08-28 ENCOUNTER — Ambulatory Visit: Payer: BC Managed Care – PPO | Admitting: Allergy

## 2019-07-03 ENCOUNTER — Ambulatory Visit: Payer: Self-pay | Admitting: *Deleted

## 2019-07-03 ENCOUNTER — Encounter: Payer: Self-pay | Admitting: Family

## 2019-07-03 NOTE — Telephone Encounter (Signed)
Chest pain started Tuesday- sharp on left. Sore /tender- comes and goes. Patient does not have BP problems.  Reason for Disposition . [1] Intermittent  chest pain or "angina" AND [2] increasing in severity or frequency  (Exception: pains lasting a few seconds)  Answer Assessment - Initial Assessment Questions 1. LOCATION: "Where does it hurt?"       Left at the heart 2. RADIATION: "Does the pain go anywhere else?" (e.g., into neck, jaw, arms, back)     No radiation- did not go anywhere else 3. ONSET: "When did the chest pain begin?" (Minutes, hours or days)      Tuesday- not as intense- tender in area- lighter pain- days- comes and goes. Less than 1 minute or 1 minutes 4. PATTERN "Does the pain come and go, or has it been constant since it started?"  "Does it get worse with exertion?"      Comes and goes- possibly twice daily- sporatic 5. DURATION: "How long does it last" (e.g., seconds, minutes, hours)     One minute at most 6. SEVERITY: "How bad is the pain?"  (e.g., Scale 1-10; mild, moderate, or severe)    - MILD (1-3): doesn't interfere with normal activities     - MODERATE (4-7): interferes with normal activities or awakens from sleep    - SEVERE (8-10): excruciating pain, unable to do any normal activities       Severe first time- moderate-4 7. CARDIAC RISK FACTORS: "Do you have any history of heart problems or risk factors for heart disease?" (e.g., prior heart attack, angina; high blood pressure, diabetes, being overweight, high cholesterol, smoking, or strong family history of heart disease)     no 8. PULMONARY RISK FACTORS: "Do you have any history of lung disease?"  (e.g., blood clots in lung, asthma, emphysema, birth control pills)     no 9. CAUSE: "What do you think is causing the chest pain?"     stress 10. OTHER SYMPTOMS: "Do you have any other symptoms?" (e.g., dizziness, nausea, vomiting, sweating, fever, difficulty breathing, cough)       Temperature 100.4 day after  was elevated 11. PREGNANCY: "Is there any chance you are pregnant?" "When was your last menstrual period?"       Depo Provera- sporatic  Protocols used: CHEST PAIN-A-AH

## 2019-07-06 NOTE — Telephone Encounter (Signed)
Called and left message for patient to return call to clinic to let us know how she was doing. Also asked her to call us back and let us know if she went to ER or UC.

## 2019-07-06 NOTE — Telephone Encounter (Signed)
Please call and check on her today- thanks. Did she go to ER or U/C?

## 2019-07-06 NOTE — Telephone Encounter (Signed)
Left message for patient to return call to clinic and set up appointment to be evaluated for chest pain.

## 2019-07-07 ENCOUNTER — Ambulatory Visit (INDEPENDENT_AMBULATORY_CARE_PROVIDER_SITE_OTHER): Payer: No Typology Code available for payment source | Admitting: Family

## 2019-07-07 ENCOUNTER — Ambulatory Visit (INDEPENDENT_AMBULATORY_CARE_PROVIDER_SITE_OTHER)
Admission: RE | Admit: 2019-07-07 | Discharge: 2019-07-07 | Disposition: A | Discharge status: Home / Self Care | Payer: No Typology Code available for payment source | Source: Ambulatory Visit | Attending: Family | Admitting: Family

## 2019-07-07 ENCOUNTER — Encounter: Payer: Self-pay | Admitting: Family

## 2019-07-07 ENCOUNTER — Other Ambulatory Visit (INDEPENDENT_AMBULATORY_CARE_PROVIDER_SITE_OTHER): Payer: No Typology Code available for payment source

## 2019-07-07 ENCOUNTER — Other Ambulatory Visit: Payer: Self-pay

## 2019-07-07 VITALS — BP 118/84 | HR 96 | Temp 98.8°F | Ht 62.0 in | Wt 154.8 lb

## 2019-07-07 DIAGNOSIS — E049 Nontoxic goiter, unspecified: Secondary | ICD-10-CM | POA: Diagnosis not present

## 2019-07-07 DIAGNOSIS — E559 Vitamin D deficiency, unspecified: Secondary | ICD-10-CM

## 2019-07-07 DIAGNOSIS — R079 Chest pain, unspecified: Secondary | ICD-10-CM

## 2019-07-07 DIAGNOSIS — R002 Palpitations: Secondary | ICD-10-CM | POA: Diagnosis not present

## 2019-07-07 LAB — CBC WITH DIFFERENTIAL/PLATELET
Basophils Absolute: 0 10*3/uL (ref 0.0–0.1)
Basophils Relative: 0.4 % (ref 0.0–3.0)
Eosinophils Absolute: 0 10*3/uL (ref 0.0–0.7)
Eosinophils Relative: 0.6 % (ref 0.0–5.0)
HCT: 38.8 % (ref 36.0–46.0)
Hemoglobin: 13 g/dL (ref 12.0–15.0)
Lymphocytes Relative: 43.8 % (ref 12.0–46.0)
Lymphs Abs: 3.1 10*3/uL (ref 0.7–4.0)
MCHC: 33.5 g/dL (ref 30.0–36.0)
MCV: 91.6 fl (ref 78.0–100.0)
Monocytes Absolute: 0.5 10*3/uL (ref 0.1–1.0)
Monocytes Relative: 6.6 % (ref 3.0–12.0)
Neutro Abs: 3.4 10*3/uL (ref 1.4–7.7)
Neutrophils Relative %: 48.6 % (ref 43.0–77.0)
Platelets: 185 10*3/uL (ref 150.0–400.0)
RBC: 4.24 Mil/uL (ref 3.87–5.11)
RDW: 13.3 % (ref 11.5–15.5)
WBC: 7 10*3/uL (ref 4.0–10.5)

## 2019-07-07 LAB — COMPREHENSIVE METABOLIC PANEL
ALT: 14 U/L (ref 0–35)
AST: 15 U/L (ref 0–37)
Albumin: 4.3 g/dL (ref 3.5–5.2)
Alkaline Phosphatase: 48 U/L (ref 39–117)
BUN: 11 mg/dL (ref 6–23)
CO2: 27 mEq/L (ref 19–32)
Calcium: 8.9 mg/dL (ref 8.4–10.5)
Chloride: 103 mEq/L (ref 96–112)
Creatinine, Ser: 0.72 mg/dL (ref 0.40–1.20)
GFR: 109.94 mL/min (ref >60.00)
Glucose, Bld: 85 mg/dL (ref 70–99)
Potassium: 3.8 mEq/L (ref 3.5–5.1)
Sodium: 138 mEq/L (ref 135–145)
Total Bilirubin: 0.4 mg/dL (ref 0.2–1.2)
Total Protein: 7.5 g/dL (ref 6.0–8.3)

## 2019-07-07 LAB — VITAMIN D 25 HYDROXY (VIT D DEFICIENCY, FRACTURES): VITD: 12.22 ng/mL — ABNORMAL LOW (ref 30.00–100.00)

## 2019-07-07 LAB — TSH: TSH: 0.63 u[IU]/mL (ref 0.35–4.50)

## 2019-07-07 NOTE — Progress Notes (Signed)
[name] is a 38 y.o. female with the following history as recorded in EpicCare:  Patient Active Problem List   Diagnosis Date Noted  . Cesarean delivery delivered 06/19/2017  . AMA (advanced maternal age) multigravida 13+, second trimester 03/04/2017  . 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 Previous cesarean section x 2 05/01/2012  . Anemia 05/01/2012    Current Outpatient Medications  Medication Sig Dispense Refill  . EPINEPHrine (EPIPEN 2-PAK) 0.3 mg/0.3 mL IJ SOAJ injection EpiPen 2-Pak 0.3 mg/0.3 mL injection, auto-injector    . MedroxyPROGESTERone Acetate 150 MG/ML SUSY INJECT 1 ML EVERY 3 MONTHS BY INTRAMUSCULAR ROUTE.  4  . mometasone (NASONEX) 50 MCG/ACT nasal spray Nasonex 50 mcg/actuation Spray     No current facility-administered medications for this visit.     Allergies: Carrot [daucus carota], Shellfish allergy, and Watermelon [citrullus vulgaris]  Past Medical History:  Diagnosis Date  . Allergy   . Anemia   . Chicken pox   . Pelvic adhesive disease    noted at 2nd c-section op note in Epic.     Past Surgical History:  Procedure Laterality Date  . CESAREAN SECTION    . CESAREAN SECTION  06/18/2012   Procedure: CESAREAN SECTION;  Surgeon: Ena Dawley, MD;  Location: [facility];  Service: Gynecology;  Laterality: N/A;  with delivery of baby boy at 28  . CESAREAN SECTION N/A 06/19/2017   Procedure: Repeat CESAREAN SECTION;  Surgeon: Delsa Bern, MD;  Location: [place];  Service: Obstetrics;  Laterality: N/A;  RNFA IS NEEDED  . DENTAL SURGERY      Family History  Problem Relation Age of Onset  . Cancer Maternal Grandfather   . Prostate cancer Maternal Grandfather   . Hypertension Mother   . Allergic rhinitis Mother   . Arthritis Maternal Grandmother   . Hypertension Maternal Grandmother   . Diabetes Maternal Grandmother   . Allergic rhinitis Father   . Allergic rhinitis Sister   . Food Allergy Sister   . Food Allergy Son   . Angioedema  Neg Hx   . Asthma Neg Hx   . Atopy Neg Hx   . Eczema Neg Hx   . Immunodeficiency Neg Hx   . Urticaria Neg Hx     Social History   Tobacco Use  . Smoking status: Never Smoker  . Smokeless tobacco: Never Used  Substance Use Topics  . Alcohol use: No    Subjective:  Patient complaining of chest pain on and off for the past week; felt that she began to experience palpitations on Sunday; does feel that she is more short of breath in the past few days; has been told she was anemic with pregnancy; feels that stress level has been high recently but feels she is handling it well; no changes in bowel movements or appetite; no coffee grounds emesis.   Last depo was done in the past 1 1/2 month- has been on this since age 56; LMP- 3 weeks ago;    Objective:  Vitals:   07/07/19 1239  BP: 118/84  Pulse: 96  Temp: 98.8 F (37.1 C)  TempSrc: Oral  SpO2: 97%  Weight: 154 lb 12.8 oz (70.2 kg)  Height: 5' 2"  (1.575 m)    General: Well developed, well nourished, in no acute distress  Skin : Warm and dry.  Head: Normocephalic and atraumatic  Eyes: Sclera and conjunctiva clear; pupils round and reactive to light; extraocular movements intact  Ears: External normal; canals clear; tympanic  membranes normal  Oropharynx: Pink, supple. No suspicious lesions  Neck: Supple without enlarged thryoid gland, no adenopathy  Lungs: Respirations unlabored; clear to auscultation bilaterally without wheeze, rales, rhonchi  CVS exam: normal rate, regular rhythm,  Abdomen: Soft; nontender; nondistended; normoactive bowel sounds; no masses or hepatosplenomegaly  Neurologic: Alert and oriented; speech intact; face symmetrical; moves all extremities well; CNII-XII intact without focal deficit   Assessment:  1. Chest pain, unspecified type   2. Palpitations   3. Vitamin D deficiency   4. Enlarged thyroid gland     Plan:  Update EKG today- no acute changes; update labs and CXR today; will plan to check thyroid  ultrasound as well; follow-up to be determined.   No follow-ups on file.  Orders Placed This Encounter  Procedures  . DG Chest 2 View    Standing Status:   Future    Standing Expiration Date:   09/06/2020    Order Specific Question:   Reason for Exam (SYMPTOM  OR DIAGNOSIS REQUIRED)    Answer:   palpitations/ atypical chest pain    Order Specific Question:   Is patient pregnant?    Answer:   No    Order Specific Question:   Preferred imaging location?    Answer:   [facility]    Order Specific Question:   Radiology Contrast Protocol - do NOT remove file path    Answer:   [email or web address]  . US THYROID    Standing Status:   Future    Standing Expiration Date:   09/06/2020    Order Specific Question:   Reason for Exam (SYMPTOM  OR DIAGNOSIS REQUIRED)    Answer:   enlarged thyroid gland    Order Specific Question:   Preferred imaging location?    Answer:   [facility]  . CBC w/Diff    Standing Status:   Future    Number of Occurrences:   1    Standing Expiration Date:   07/06/2020  . Comp Met (CMET)    Standing Status:   Future    Number of Occurrences:   1    Standing Expiration Date:   07/06/2020  . TSH    Standing Status:   Future    Number of Occurrences:   1    Standing Expiration Date:   07/06/2020  . D-Dimer, Quantitative    Standing Status:   Future    Number of Occurrences:   1    Standing Expiration Date:   07/06/2020  . Vitamin D (25 hydroxy)    Standing Status:   Future    Number of Occurrences:   1    Standing Expiration Date:   07/06/2020  . EKG 12-Lead    Requested Prescriptions    No prescriptions requested or ordered in this encounter

## 2019-07-08 ENCOUNTER — Other Ambulatory Visit: Payer: Self-pay | Admitting: Family

## 2019-07-08 DIAGNOSIS — R0602 Shortness of breath: Secondary | ICD-10-CM

## 2019-07-08 LAB — D-DIMER, QUANTITATIVE: D-Dimer, Quant: 0.47 mcg/mL FEU (ref <0.50)

## 2019-07-08 MED ORDER — ALBUTEROL SULFATE HFA 108 (90 BASE) MCG/ACT IN AERS: 2.0000 | INHALATION_SPRAY | Freq: Four times a day (QID) | RESPIRATORY_TRACT | 0 refills | Status: DC | PRN

## 2019-07-08 MED ORDER — VITAMIN D (ERGOCALCIFEROL) 1.25 MG (50000 UNIT) PO CAPS: 50000.0000 [IU] | ORAL_CAPSULE | ORAL | 0 refills | Status: AC

## 2019-07-21 ENCOUNTER — Ambulatory Visit
Admission: RE | Admit: 2019-07-21 | Discharge: 2019-07-21 | Disposition: A | Discharge status: Home / Self Care | Payer: No Typology Code available for payment source | Source: Ambulatory Visit | Attending: Family | Admitting: Family

## 2019-07-21 ENCOUNTER — Encounter: Payer: Self-pay | Admitting: Family

## 2019-07-21 DIAGNOSIS — E049 Nontoxic goiter, unspecified: Secondary | ICD-10-CM

## 2019-08-03 ENCOUNTER — Encounter: Payer: Self-pay | Admitting: Family

## 2019-10-09 ENCOUNTER — Other Ambulatory Visit: Payer: Self-pay | Admitting: Family

## 2019-10-09 MED ORDER — ALBUTEROL SULFATE HFA 108 (90 BASE) MCG/ACT IN AERS: 2.0000 | INHALATION_SPRAY | Freq: Four times a day (QID) | RESPIRATORY_TRACT | 0 refills | Status: DC | PRN

## 2020-01-05 DIAGNOSIS — Z3009 Encounter for other general counseling and advice on contraception: Secondary | ICD-10-CM | POA: Diagnosis not present

## 2020-02-12 DIAGNOSIS — Z6828 Body mass index (BMI) 28.0-28.9, adult: Secondary | ICD-10-CM | POA: Diagnosis not present

## 2020-02-12 DIAGNOSIS — Z3040 Encounter for surveillance of contraceptives, unspecified: Secondary | ICD-10-CM | POA: Diagnosis not present

## 2020-02-12 DIAGNOSIS — Z0000 Encounter for general adult medical examination without abnormal findings: Secondary | ICD-10-CM | POA: Diagnosis not present

## 2020-02-12 DIAGNOSIS — Z124 Encounter for screening for malignant neoplasm of cervix: Secondary | ICD-10-CM | POA: Diagnosis not present

## 2020-03-28 DIAGNOSIS — Z3009 Encounter for other general counseling and advice on contraception: Secondary | ICD-10-CM | POA: Diagnosis not present

## 2020-04-14 ENCOUNTER — Ambulatory Visit: Payer: No Typology Code available for payment source | Attending: Family

## 2020-04-14 DIAGNOSIS — Z23 Encounter for immunization: Secondary | ICD-10-CM

## 2020-04-14 NOTE — Progress Notes (Signed)
   Covid-19 Vaccination Clinic  Name:  SHALIAH WANN    MRN: [id number] DOB: 10/05/1981  04/14/2020  Ms. Krall was observed post Covid-19 immunization for 30 minutes without incident. She was provided with Vaccine Information Sheet and instruction to access the V-Safe system.   Ms. Hribar was instructed to call 911 with any severe reactions post vaccine: Marland Kitchen Difficulty breathing  . Swelling of face and throat  . A fast heartbeat  . A bad rash all over body  . Dizziness and weakness   Immunizations Administered    Name Date Dose VIS Date Route   Moderna COVID-19 Vaccine 04/14/2020  4:28 PM 0.5 mL 12/01/2019 Intramuscular   Manufacturer: Moderna   Lot: [id number]   NDC: 43837-793-96

## 2020-05-17 ENCOUNTER — Ambulatory Visit: Payer: No Typology Code available for payment source | Attending: Family

## 2020-05-17 ENCOUNTER — Ambulatory Visit: Payer: No Typology Code available for payment source

## 2020-05-17 DIAGNOSIS — Z23 Encounter for immunization: Secondary | ICD-10-CM

## 2020-05-17 NOTE — Progress Notes (Signed)
   Covid-19 Vaccination Clinic  Name:  [name]    MRN: [id number] DOB: 02-26-1981  05/17/2020  [name] was observed post Covid-19 immunization for 15 minutes without incident. She was provided with Vaccine Information Sheet and instruction to access the V-Safe system.   [name] was instructed to call 911 with any severe reactions post vaccine: Marland Kitchen Difficulty breathing  . Swelling of face and throat  . A fast heartbeat  . A bad rash all over body  . Dizziness and weakness   Immunizations Administered    Name Date Dose VIS Date Route   Moderna COVID-19 Vaccine 05/17/2020  4:10 PM 0.5 mL 12/2019 Intramuscular   Manufacturer: Moderna   Lot: [id number]   NDC: 35597-416-38

## 2020-06-20 DIAGNOSIS — Z3042 Encounter for surveillance of injectable contraceptive: Secondary | ICD-10-CM | POA: Diagnosis not present

## 2020-12-05 DIAGNOSIS — Z3042 Encounter for surveillance of injectable contraceptive: Secondary | ICD-10-CM | POA: Diagnosis not present

## 2020-12-15 ENCOUNTER — Ambulatory Visit: Payer: No Typology Code available for payment source | Attending: Internal Medicine

## 2020-12-15 DIAGNOSIS — Z23 Encounter for immunization: Secondary | ICD-10-CM

## 2020-12-15 NOTE — Progress Notes (Signed)
   Covid-19 Vaccination Clinic  Name:  DECEMBER HEDTKE    MRN: [id number] DOB: 1981/01/14  12/15/2020  Ms. Pala was observed post Covid-19 immunization for 15 minutes without incident. She was provided with Vaccine Information Sheet and instruction to access the V-Safe system.   Ms. Hoopingarner was instructed to call 911 with any severe reactions post vaccine: Marland Kitchen Difficulty breathing  . Swelling of face and throat  . A fast heartbeat  . A bad rash all over body  . Dizziness and weakness   Immunizations Administered    Name Date Dose VIS Date Route   Moderna Covid-19 Booster Vaccine 12/15/2020  5:34 PM 0.25 mL 10/19/2020 Intramuscular   Manufacturer: Moderna   Lot: [id number]   NDC: 03013-143-88

## 2021-01-13 IMAGING — US US THYROID
1 series · 13 of 25 positions shown · non-contrast
Comparison: None.

CLINICAL DATA: Thyromegaly on exam

EXAM:
THYROID ULTRASOUND
TECHNIQUE: Ultrasound examination of the thyroid gland and adjacent soft
tissues was performed.

[Series 1: us thyroid · 0.05mm/px · 13 of 70 slices shown]
[im 1/70]
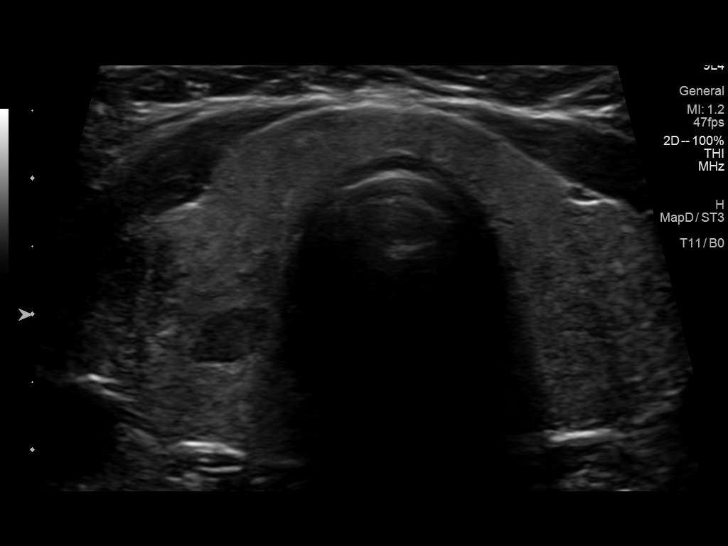
[im 6/70]
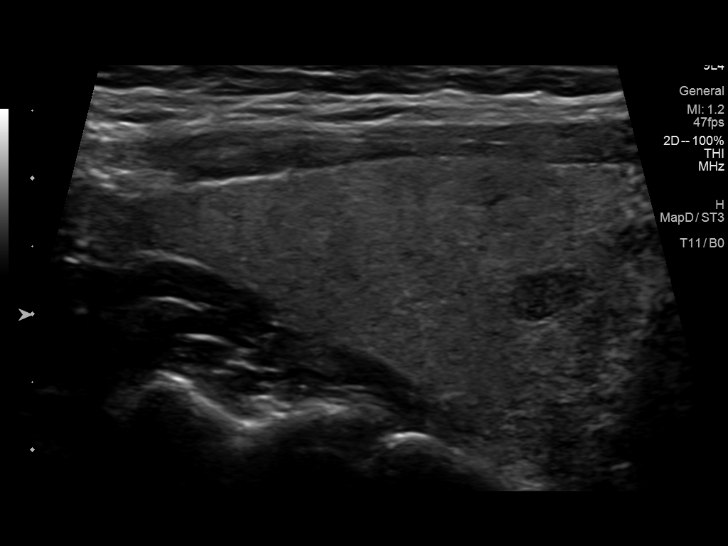
[im 12/70]
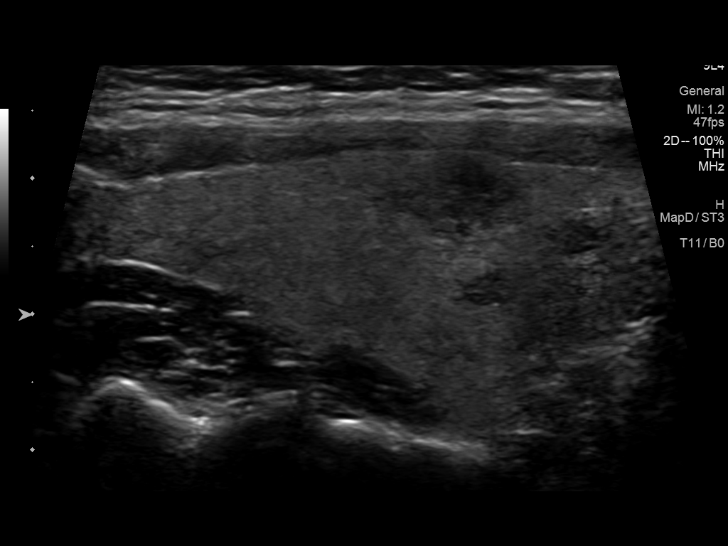
[im 18/70]
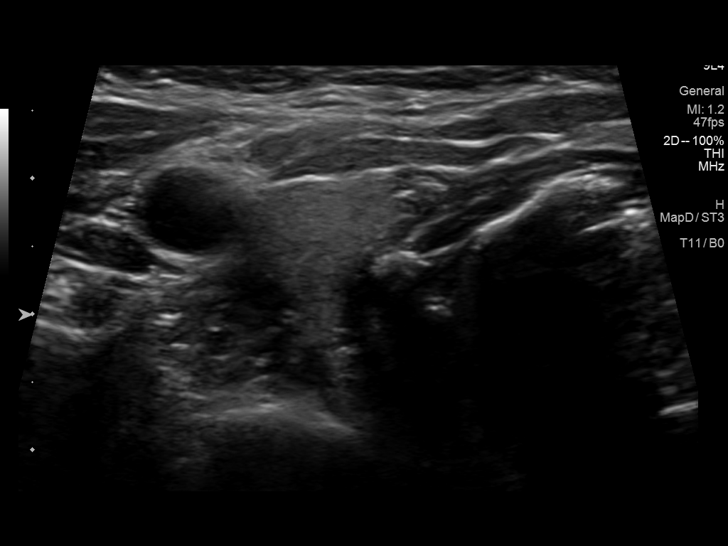
[im 24/70]
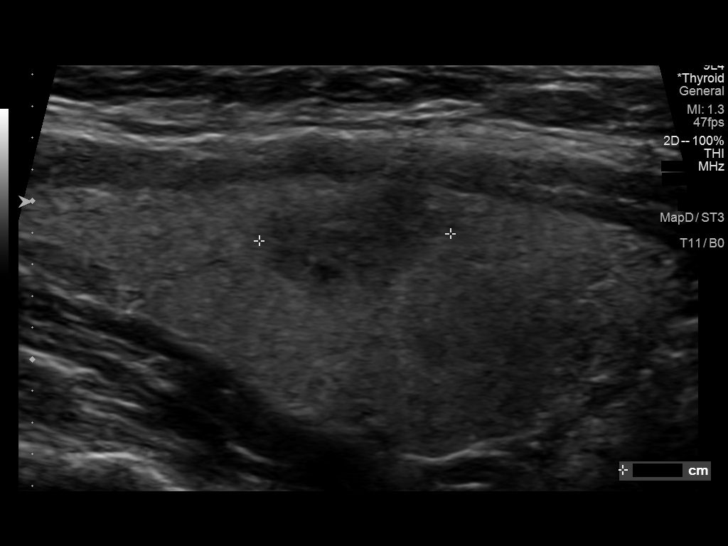
[im 29/70]
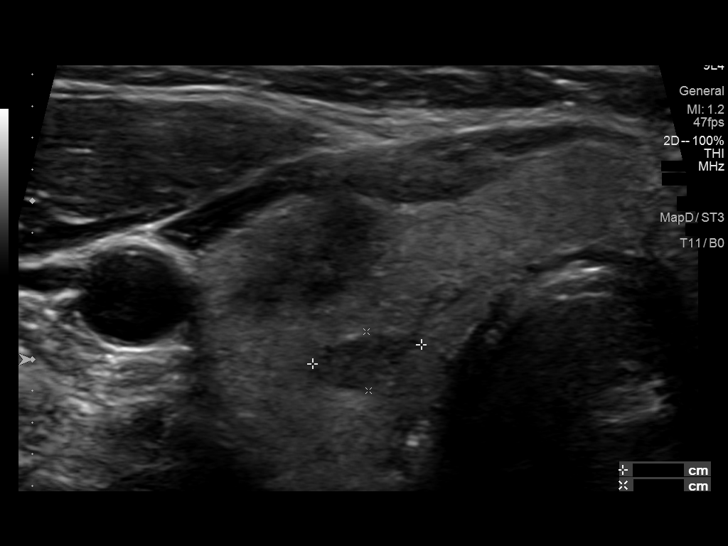
[im 35/70]
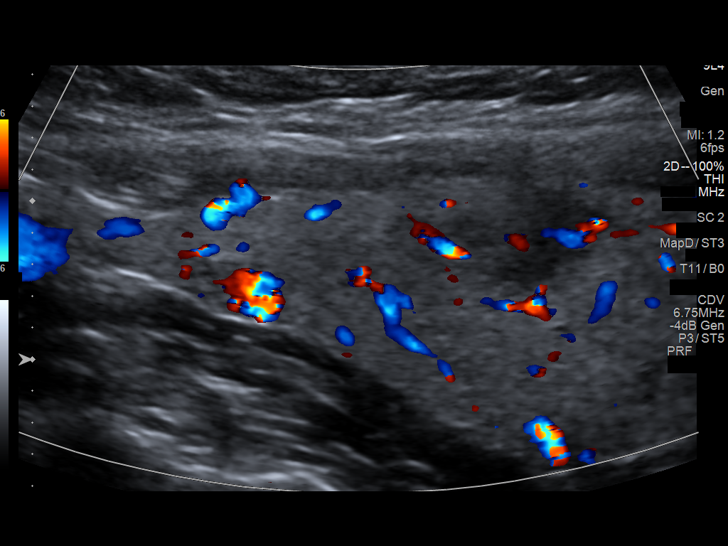
[im 41/70]
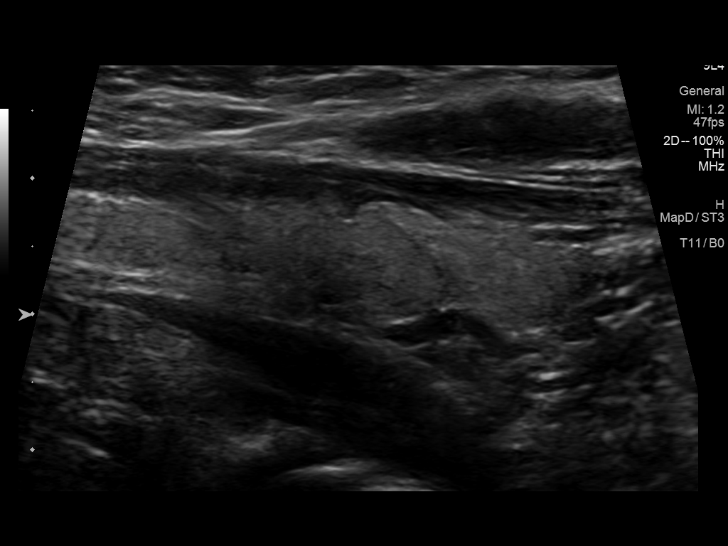
[im 47/70]
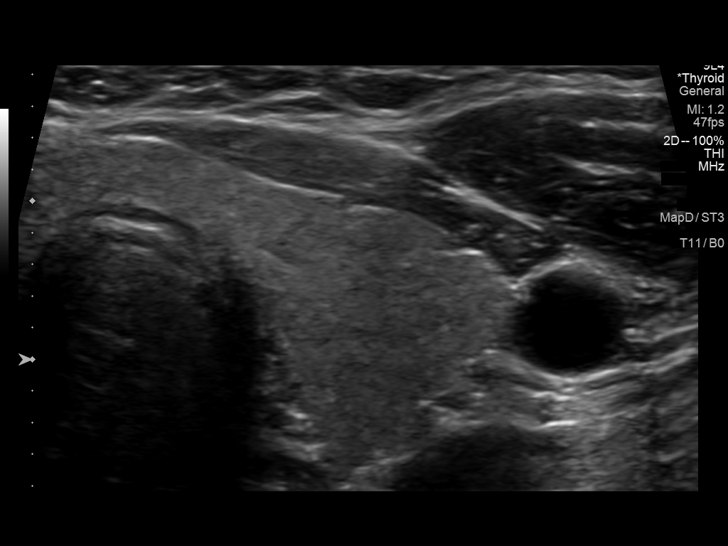
[im 52/70]
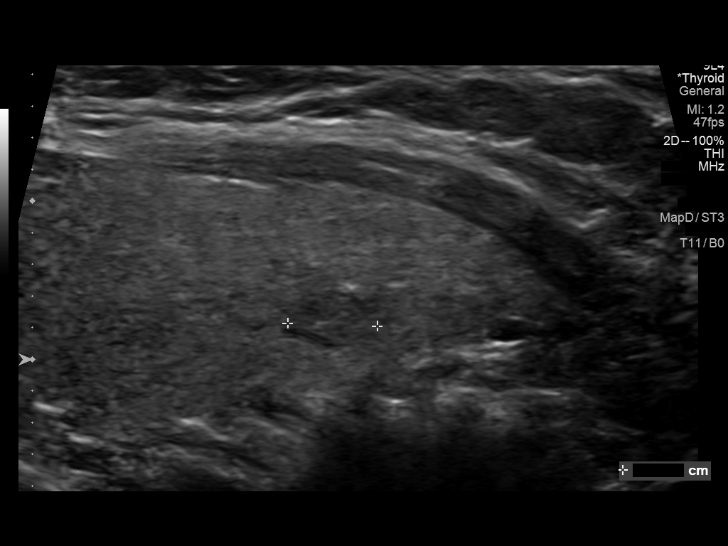
[im 58/70]
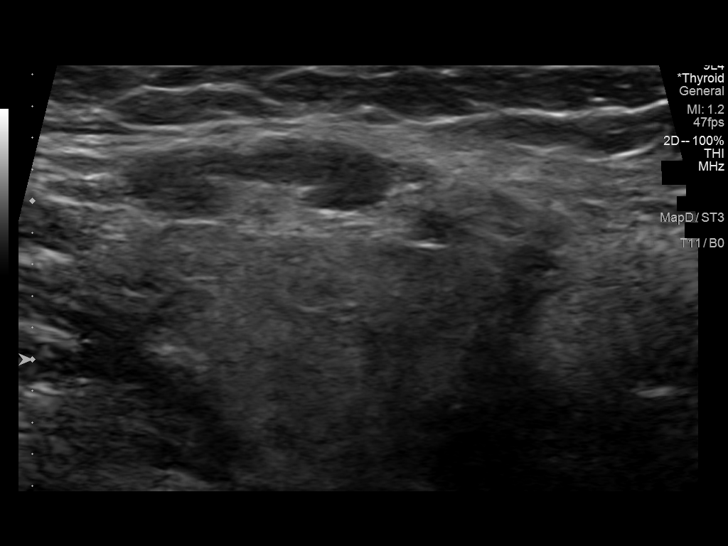
[im 64/70]
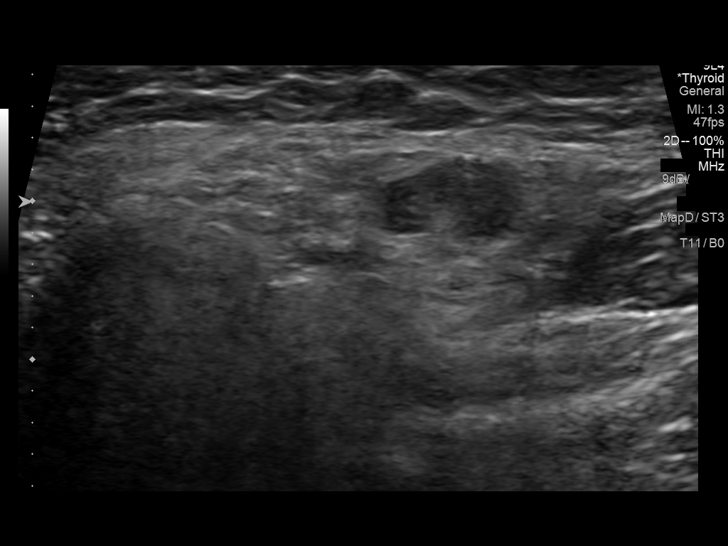
[im 70/70]
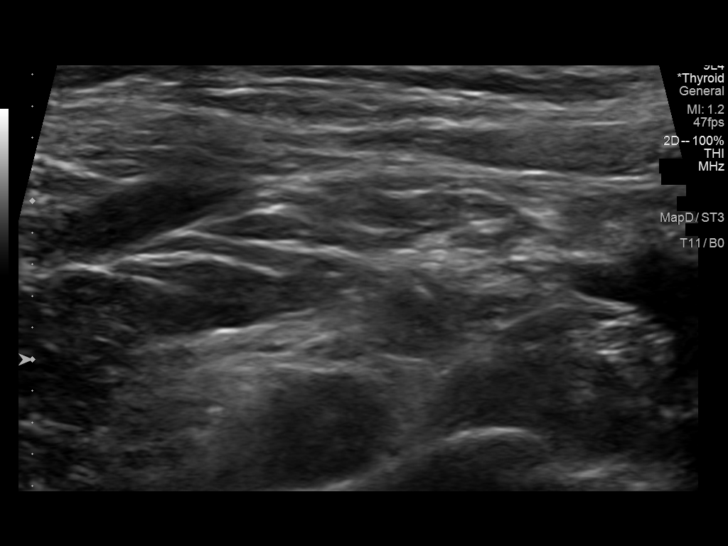

[13 of 25 positions shown; findings below may reference images not displayed]

FINDINGS: Parenchymal Echotexture: Mildly heterogenous

Isthmus: 3 mm

Right lobe: 4.9 x 2.2 x 1.9 cm

Left lobe: 4.7 x 1.6 x 1.7 cm

_________________________________________________________

Estimated total number of nodules >/= 1 cm: 1

Number of spongiform nodules >/=  2 cm not described below (TR1): 0

Number of mixed cystic and solid nodules >/= 1.5 cm not described
below (TR2): 0

_________________________________________________________

Nodule # 2:

Location: Right; Mid

Maximum size: 1.2 cm; Other 2 dimensions: 1.0 x 0.8 cm

Composition: solid/almost completely solid (2)

Echogenicity: hypoechoic (2)

Shape: not taller-than-wide (0)

Margins: ill-defined (0)

Echogenic foci: none (0)

ACR TI-RADS total points: 4.

ACR TI-RADS risk category: TR4 (4-6 points).

ACR TI-RADS recommendations:

*Given size (>/= 1 - 1.4 cm) and appearance, a follow-up ultrasound
in 1 year should be considered based on TI-RADS criteria.

_________________________________________________________

There are additional bilateral subcentimeter hypoechoic nodules all
measuring 7 mm or less in size, these would not meet criteria for
any biopsy or follow-up. Normal vascularity. No regional adenopathy.
IMPRESSION: 1.2 cm right mid thyroid TR 4 nodule meets criteria follow-up at 1
year.

The above is in keeping with the ACR TI-RADS recommendations - [HOSPITAL] 2768;[DATE].

## 2021-02-11 ENCOUNTER — Encounter: Payer: Self-pay | Admitting: Emergency Medicine

## 2021-02-11 ENCOUNTER — Other Ambulatory Visit: Payer: Self-pay

## 2021-02-11 ENCOUNTER — Ambulatory Visit
Admission: EM | Admit: 2021-02-11 | Discharge: 2021-02-11 | Disposition: A | Discharge status: Home / Self Care | Payer: Medicaid Other | Attending: Physician Assistant | Admitting: Physician Assistant

## 2021-02-11 DIAGNOSIS — L02413 Cutaneous abscess of right upper limb: Secondary | ICD-10-CM

## 2021-02-11 MED ORDER — CEPHALEXIN 250 MG/5ML PO SUSR: 500.0000 mg | Freq: Two times a day (BID) | ORAL | 0 refills | Status: AC

## 2021-02-11 NOTE — Discharge Instructions (Addendum)
Apply warm compress Take medication as instructed Return if symptoms worsen

## 2021-02-11 NOTE — ED Provider Notes (Signed)
[facility]    CSN: [id number] Arrival date & time: 02/11/21  1012      History   Chief Complaint Chief Complaint  Patient presents with  . Cyst    HPI [name] is a 39 y.o. female.   Pt complains of a red raised bump to her right forearm that started 4 days ago.  She reports mild tenderness.  She has applied a warm compress with no relief.  No other lesions noted.     Past Medical History:  Diagnosis Date  . Allergy   . Anemia   . Chicken pox   . Pelvic adhesive disease    noted at 2nd c-section op note in Epic.     Patient Active Problem List   Diagnosis Date Noted  . Cesarean delivery delivered 06/19/2017  . AMA (advanced maternal age) multigravida 35+, second trimester 03/04/2017  . 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 Previous cesarean section x 2 05/01/2012  . Anemia 05/01/2012    Past Surgical History:  Procedure Laterality Date  . CESAREAN SECTION    . CESAREAN SECTION  06/18/2012   Procedure: CESAREAN SECTION;  Surgeon: Kirkland Hun, MD;  Location: [facility];  Service: Gynecology;  Laterality: N/A;  with delivery of baby boy at 48  . CESAREAN SECTION N/A 06/19/2017   Procedure: Repeat CESAREAN SECTION;  Surgeon: Silverio Lay, MD;  Location: [facility];  Service: Obstetrics;  Laterality: N/A;  RNFA IS NEEDED  . DENTAL SURGERY      OB History    Gravida  5   Para  3   Term  3   Preterm  0   AB  1   Living  3     SAB  1   IAB  0   Ectopic  0   Multiple  0   Live Births  3            Home Medications    Prior to Admission medications   Medication Sig Start Date End Date Taking? Authorizing Provider  cephALEXin (KEFLEX) 250 MG/5ML suspension Take 10 mLs (500 mg total) by mouth 2 (two) times daily for 7 days. 02/11/21 02/18/21 Yes Antoninette Lerner, Shanda Bumps, PA-C  albuterol (VENTOLIN HFA) 108 (90 Base) MCG/ACT inhaler Inhale 2 puffs into the lungs every 6 (six) hours as needed for wheezing or shortness of  breath. 10/09/19   Olive Bass, FNP  EPINEPHrine 0.3 mg/0.3 mL IJ SOAJ injection EpiPen 2-Pak 0.3 mg/0.3 mL injection, auto-injector    [provider]  MedroxyPROGESTERone Acetate 150 MG/ML SUSY INJECT 1 ML EVERY 3 MONTHS BY INTRAMUSCULAR ROUTE. 09/10/17   [provider]  mometasone (NASONEX) 50 MCG/ACT nasal spray Nasonex 50 mcg/actuation Spray    [provider]    Family History Family History  Problem Relation Age of Onset  . Cancer Maternal Grandfather   . Prostate cancer Maternal Grandfather   . Hypertension Mother   . Allergic rhinitis Mother   . Arthritis Maternal Grandmother   . Hypertension Maternal Grandmother   . Diabetes Maternal Grandmother   . Allergic rhinitis Father   . Allergic rhinitis Sister   . Food Allergy Sister   . Food Allergy Son   . Angioedema Neg Hx   . Asthma Neg Hx   . Atopy Neg Hx   . Eczema Neg Hx   . Immunodeficiency Neg Hx   . Urticaria Neg Hx     Social History Social History   Tobacco  Use  . Smoking status: Never Smoker  . Smokeless tobacco: Never Used  Substance Use Topics  . Alcohol use: No  . Drug use: No     Allergies   Carrot [daucus carota], Shellfish allergy, and Watermelon [citrullus vulgaris]   Review of Systems Review of Systems  Constitutional: Negative for chills and fever.  HENT: Negative for ear pain and sore throat.   Eyes: Negative for pain and visual disturbance.  Respiratory: Negative for cough and shortness of breath.   Cardiovascular: Negative for chest pain and palpitations.  Gastrointestinal: Negative for abdominal pain and vomiting.  Genitourinary: Negative for dysuria and hematuria.  Musculoskeletal: Negative for arthralgias and back pain.  Skin: Negative for color change and rash.       Area of redness and swelling to right forearm  Neurological: Negative for seizures and syncope.  All other systems reviewed and are negative.    Physical Exam Triage Vital  Signs ED Triage Vitals  Enc Vitals Group     BP 02/11/21 1119 119/86     Pulse Rate 02/11/21 1119 77     Resp 02/11/21 1119 15     Temp 02/11/21 1119 98.3 F (36.8 C)     Temp Source 02/11/21 1119 Oral     SpO2 02/11/21 1119 97 %     Weight --      Height --      Head Circumference --      Peak Flow --      Pain Score 02/11/21 1116 3     Pain Loc --      Pain Edu? --      Excl. in GC? --    No data found.  Updated Vital Signs BP 119/86 (BP Location: Left Arm)   Pulse 77   Temp 98.3 F (36.8 C) (Oral)   Resp 15   LMP  (LMP Unknown)   SpO2 97%   Visual Acuity Right Eye Distance:   Left Eye Distance:   Bilateral Distance:    Right Eye Near:   Left Eye Near:    Bilateral Near:     Physical Exam Vitals and nursing note reviewed.  Constitutional:      General: She is not in acute distress.    Appearance: She is well-developed and well-nourished.  HENT:     Head: Normocephalic and atraumatic.  Eyes:     Conjunctiva/sclera: Conjunctivae normal.  Cardiovascular:     Rate and Rhythm: Normal rate and regular rhythm.     Heart sounds: No murmur heard.   Pulmonary:     Effort: Pulmonary effort is normal. No respiratory distress.     Breath sounds: Normal breath sounds.  Abdominal:     Palpations: Abdomen is soft.     Tenderness: There is no abdominal tenderness.  Musculoskeletal:        General: No edema.     Cervical back: Neck supple.  Skin:    General: Skin is warm and dry.     Comments: Non fluctuant 3x3 area of redness and swelling, mild tenderness to palpation.   Neurological:     Mental Status: She is alert.  Psychiatric:        Mood and Affect: Mood and affect normal.      UC Treatments / Results  Labs (all labs ordered are listed, but only abnormal results are displayed) Labs Reviewed - No data to display  EKG   Radiology No results found.  Procedures Procedures (including critical care  time)  Medications Ordered in UC Medications -  No data to display  Initial Impression / Assessment and Plan / UC Course  I have reviewed the triage vital signs and the nursing notes.  Pertinent labs & imaging results that were available during my care of the patient were reviewed by me and considered in my medical decision making (see chart for details).      Abscess to right forearm, non fluctuant on exam; no indication for I&D. Antibiotic prescribed.  She will continue with warm compress.  Return precautions discussed.  Final Clinical Impressions(s) / UC Diagnoses   Final diagnoses:  Abscess of right arm     Discharge Instructions     Apply warm compress Take medication as instructed Return if symptoms worsen   ED Prescriptions    Medication Sig Dispense Auth. Provider   cephALEXin (KEFLEX) 250 MG/5ML suspension Take 10 mLs (500 mg total) by mouth 2 (two) times daily for 7 days. 140 mL Jodell Cipro, PA-C     PDMP not reviewed this encounter.   Jodell Cipro, PA-C 02/11/21 1352

## 2021-02-11 NOTE — ED Triage Notes (Addendum)
Patient c/o RT arm cyst since Wednesday.   Patient has a circular area of redness present on arm.   Patient endorses tenderness and warmth on site.   Patient denies any other symptoms.   Patient used warm compresses with no relief of symptoms.

## 2021-02-20 DIAGNOSIS — Z6829 Body mass index (BMI) 29.0-29.9, adult: Secondary | ICD-10-CM | POA: Diagnosis not present

## 2021-02-20 DIAGNOSIS — Z3040 Encounter for surveillance of contraceptives, unspecified: Secondary | ICD-10-CM | POA: Diagnosis not present

## 2021-02-20 DIAGNOSIS — Z01419 Encounter for gynecological examination (general) (routine) without abnormal findings: Secondary | ICD-10-CM | POA: Diagnosis not present

## 2021-02-20 DIAGNOSIS — Z0000 Encounter for general adult medical examination without abnormal findings: Secondary | ICD-10-CM | POA: Diagnosis not present

## 2021-02-20 DIAGNOSIS — H5213 Myopia, bilateral: Secondary | ICD-10-CM | POA: Diagnosis not present

## 2021-02-27 DIAGNOSIS — Z3042 Encounter for surveillance of injectable contraceptive: Secondary | ICD-10-CM | POA: Diagnosis not present

## 2021-03-21 ENCOUNTER — Ambulatory Visit: Payer: No Typology Code available for payment source | Admitting: Family

## 2021-04-18 ENCOUNTER — Other Ambulatory Visit: Payer: Self-pay

## 2021-04-18 ENCOUNTER — Ambulatory Visit (INDEPENDENT_AMBULATORY_CARE_PROVIDER_SITE_OTHER): Payer: Medicaid Other | Admitting: Family Medicine

## 2021-04-18 ENCOUNTER — Encounter: Payer: Self-pay | Admitting: Family Medicine

## 2021-04-18 VITALS — BP 120/80 | HR 74 | Ht 64.0 in | Wt 167.0 lb

## 2021-04-18 DIAGNOSIS — D649 Anemia, unspecified: Secondary | ICD-10-CM | POA: Diagnosis not present

## 2021-04-18 DIAGNOSIS — Z131 Encounter for screening for diabetes mellitus: Secondary | ICD-10-CM | POA: Diagnosis not present

## 2021-04-18 DIAGNOSIS — Z1159 Encounter for screening for other viral diseases: Secondary | ICD-10-CM | POA: Diagnosis not present

## 2021-04-18 DIAGNOSIS — E041 Nontoxic single thyroid nodule: Secondary | ICD-10-CM | POA: Diagnosis not present

## 2021-04-18 DIAGNOSIS — T7802XD Anaphylactic reaction due to shellfish (crustaceans), subsequent encounter: Secondary | ICD-10-CM

## 2021-04-18 DIAGNOSIS — E663 Overweight: Secondary | ICD-10-CM | POA: Diagnosis not present

## 2021-04-18 DIAGNOSIS — D509 Iron deficiency anemia, unspecified: Secondary | ICD-10-CM | POA: Diagnosis not present

## 2021-04-18 DIAGNOSIS — Z0000 Encounter for general adult medical examination without abnormal findings: Secondary | ICD-10-CM

## 2021-04-18 DIAGNOSIS — T7802XA Anaphylactic reaction due to shellfish (crustaceans), initial encounter: Secondary | ICD-10-CM | POA: Insufficient documentation

## 2021-04-18 MED ORDER — EPINEPHRINE 0.3 MG/0.3ML IJ SOAJ: INTRAMUSCULAR | 0 refills | Status: DC

## 2021-04-18 NOTE — Progress Notes (Signed)
    SUBJECTIVE:   Chief compliant/HPI: new patient/annual examination  ARMELIA PENTON is a 40 y.o. who presents today for an annual exam and to establish care. Previously followed by Velora Heckler, but recently had change in insurance.  Patient does not have any current concerns.   Updated history tabs and problem list.    PMHx: anemia in pregnancy, vit D deficiency, thyroid nodule Surgical Hx: c-section x3 Social hx: occupation- homemaker Denies alcohol, tobacco or recreational drug use Currently sexually active w/husband, followed by OBGYN (uses depo for contraception)  OBJECTIVE:   BP 120/80   Pulse 74   Ht $R'5\' 4"'QY$  (1.626 m)   Wt 167 lb (75.8 kg)   SpO2 99%   BMI 28.67 kg/m   Gen: alert, well-appearing, NAD HEENT: PERRL, EOMI, oropharynx normal, TM clear bilaterally Neck: supple, thyroid normal in size, no palpable nodules appreciated Lymph Nodes: no cervical lymphadenopathy CV: RRR, normal S1/S2 without m/r/g Resp: normal WOB, lungs CTAB Abd: soft, nontender Ext: no peripheral edema, 2+ distal pulses Neuro: grossly intact, normal gait  ASSESSMENT/PLAN:   Thyroid nodule Thyroid ultrasound on 07/21/2019 showed 1.2 cm right mid thyroid TR 4 nodule that met criteria for follow-up ultrasound at 1 year. TSH wnl at that time. No palpable nodule appreciated on physical exam today. Patient has not had repeat ultrasound performed. -Thyroid ultrasound ordered today  Annual Examination  PHQ score 0, reviewed and discussed. Blood pressure reviewed and at goal.  The patient currently uses depo for contraception.  Advanced directives: discussed. Patient given copy of blue advanced directives packet.  Considered the following items based upon USPSTF recommendations: HIV testing: negative 04/03/2017, no new risk factors Hepatitis C: ordered Hepatitis B: negative in 2018, no new risk factors Syphilis if at high risk: not at high risk, negative in 2018 during pregnancy GC/CT not at high  risk and not ordered, follows w/OBGYN Lipid panel discussed based upon AHA recommendations and not ordered, but will plan to order next year  Reviewed risk factors for latent tuberculosis and not indicated  Discussed family history, BRCA testing not indicated.  Cervical cancer screening: prior Pap reviewed, repeat due in 2026. Follows w/OBGYN Immunizations UTD   BMI 28. Screening A1c ordered. Exercise and nutrition counseling provided.   Follow up in 1 year or sooner if indicated.    Alcus Dad, MD [place]

## 2021-04-18 NOTE — Assessment & Plan Note (Signed)
Thyroid ultrasound on 07/21/2019 showed 1.2 cm right mid thyroid TR 4 nodule that met criteria for follow-up ultrasound at 1 year. TSH wnl at that time. No palpable nodule appreciated on physical exam today. Patient has not had repeat ultrasound performed. -Thyroid ultrasound ordered today

## 2021-04-18 NOTE — Patient Instructions (Addendum)
It was great to meet you!  We are checking some labs today, I will send the results via mychart or call if they are abnormal. I have also ordered a thyroid ultrasound for you.   One of the most important things you can do for your health is to incorporate regular exercise. The goal is 150 minutes/week of moderate-intensity physical activity (something that gets your heart rate up). Start small and work your way up (maybe aim for 10-30 minutes of walking 3x/week).   Also try your best to minimize processed foods and eat at least 1 fruit and vegetable every single day.  Take care and seek immediate care sooner if you develop any concerns.   Dr. [facility]

## 2021-04-19 ENCOUNTER — Ambulatory Visit
Admission: RE | Admit: 2021-04-19 | Discharge: 2021-04-19 | Disposition: A | Discharge status: Home / Self Care | Payer: No Typology Code available for payment source | Source: Ambulatory Visit | Attending: Family Medicine | Admitting: Family Medicine

## 2021-04-19 DIAGNOSIS — E041 Nontoxic single thyroid nodule: Secondary | ICD-10-CM

## 2021-04-19 LAB — CBC
Hematocrit: 38.7 % (ref 34.0–46.6)
Hemoglobin: 13.2 g/dL (ref 11.1–15.9)
MCH: 30.7 pg (ref 26.6–33.0)
MCHC: 34.1 g/dL (ref 31.5–35.7)
MCV: 90 fL (ref 79–97)
Platelets: 222 10*3/uL (ref 150–450)
RBC: 4.3 x10E6/uL (ref 3.77–5.28)
RDW: 12.1 % (ref 11.7–15.4)
WBC: 7 10*3/uL (ref 3.4–10.8)

## 2021-04-19 LAB — HEPATITIS C ANTIBODY: Hep C Virus Ab: <0.1 s/co ratio (ref 0.0–0.9)

## 2021-04-19 LAB — HEMOGLOBIN A1C
Est. average glucose Bld gHb Est-mCnc: 114 mg/dL
Hgb A1c MFr Bld: 5.6 % (ref 4.8–5.6)

## 2021-04-20 ENCOUNTER — Telehealth: Payer: Self-pay | Admitting: *Deleted

## 2021-04-20 NOTE — Telephone Encounter (Signed)
LMOVM of pharmacy informing them that the script was approved       Jone Baseman, CMA

## 2021-04-20 NOTE — Telephone Encounter (Signed)
Received fax from pharmacy, PA needed on Epi-Pen.  Clinical questions submitted via Cover My Meds.  Waiting on response, could take up to 72 hours.  Cover My Meds info: Key: [id number]  Jone Baseman, CMA

## 2021-05-22 DIAGNOSIS — Z3042 Encounter for surveillance of injectable contraceptive: Secondary | ICD-10-CM | POA: Diagnosis not present

## 2021-06-03 ENCOUNTER — Encounter: Payer: Self-pay | Admitting: Family Medicine

## 2021-06-03 DIAGNOSIS — E663 Overweight: Secondary | ICD-10-CM | POA: Insufficient documentation

## 2021-08-14 DIAGNOSIS — Z3042 Encounter for surveillance of injectable contraceptive: Secondary | ICD-10-CM | POA: Diagnosis not present

## 2021-09-11 ENCOUNTER — Ambulatory Visit (INDEPENDENT_AMBULATORY_CARE_PROVIDER_SITE_OTHER): Payer: Medicaid Other | Admitting: Family Medicine

## 2021-09-11 ENCOUNTER — Other Ambulatory Visit: Payer: Self-pay

## 2021-09-11 VITALS — BP 104/72 | HR 71 | Ht 64.0 in | Wt 164.2 lb

## 2021-09-11 DIAGNOSIS — M25561 Pain in right knee: Secondary | ICD-10-CM

## 2021-09-11 DIAGNOSIS — Z23 Encounter for immunization: Secondary | ICD-10-CM | POA: Diagnosis not present

## 2021-09-11 NOTE — Patient Instructions (Signed)
You have a minor problem of patellofemoral tracking syndrome.  This will cause cracking and some pain, especially on activities that a deep knee bend.  That should not cause your knee to give out on you.  The giving out makes me worry about a meniscus injury.  But you don't have any history of trauma.  I think it is a fine strategy to wait and see if it happens again.  If it does, you will need more investigation.    Flu shot today.  Good luck learning the foreign language of medicine.

## 2021-09-13 ENCOUNTER — Encounter: Payer: Self-pay | Admitting: Family Medicine

## 2021-09-13 DIAGNOSIS — M25561 Pain in right knee: Secondary | ICD-10-CM | POA: Insufficient documentation

## 2021-09-13 NOTE — Progress Notes (Signed)
    SUBJECTIVE:   CHIEF COMPLAINT / HPI:   Right knee pain.  Patient has longstanding hx of knee cracking and popping especially as she walks up stairs or does deep knee bends.  For the first time ever on 8/28, her right knee "gave out" on her while standing (no torque or twisting) and she almost fell.  Had significant increase in knee pain afterward for several days.  Now pain is back to her baseline of popping and cracking.  She does not believe the knee swelled up on her.  She does not recall any trauma, recent or remote.  She was active in both dance and cheerleading as a teen.  No knee problems at that time     OBJECTIVE:   BP 104/72   Pulse 71   Ht 5\' 4"  (1.626 m)   Wt 164 lb 3.2 oz (74.5 kg)   SpO2 100%   BMI 28.18 kg/m   Right knee, no effusion, redness or deformity. Intact ligaments to provocative testing.  No catch or click with rotation.  Does have some mild crepitus to patellar compression.  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  This does not explain her knee giving out on her which is more consistent with a meniscus problem.  We discussed and will not pursue additional workup for now since this is a first time event.  If it does recur, she will need a SM or ortho referral.     , MD [facility]

## 2021-09-13 NOTE — Assessment & Plan Note (Signed)
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  This does not explain her knee giving out on her which is more consistent with a meniscus problem.  We discussed and will not pursue additional workup for now since this is a first time event.  If it does recur, she will need a SM or ortho referral.

## 2021-11-06 DIAGNOSIS — Z3042 Encounter for surveillance of injectable contraceptive: Secondary | ICD-10-CM | POA: Diagnosis not present

## 2022-01-22 DIAGNOSIS — Z309 Encounter for contraceptive management, unspecified: Secondary | ICD-10-CM | POA: Diagnosis not present

## 2022-02-21 DIAGNOSIS — Z3040 Encounter for surveillance of contraceptives, unspecified: Secondary | ICD-10-CM | POA: Diagnosis not present

## 2022-02-21 DIAGNOSIS — Z1231 Encounter for screening mammogram for malignant neoplasm of breast: Secondary | ICD-10-CM | POA: Diagnosis not present

## 2022-02-21 DIAGNOSIS — Z6829 Body mass index (BMI) 29.0-29.9, adult: Secondary | ICD-10-CM | POA: Diagnosis not present

## 2022-02-21 DIAGNOSIS — Z01419 Encounter for gynecological examination (general) (routine) without abnormal findings: Secondary | ICD-10-CM | POA: Diagnosis not present

## 2022-04-09 DIAGNOSIS — Z309 Encounter for contraceptive management, unspecified: Secondary | ICD-10-CM | POA: Diagnosis not present

## 2022-06-05 ENCOUNTER — Encounter: Payer: Self-pay | Admitting: *Deleted

## 2022-07-02 ENCOUNTER — Emergency Department (HOSPITAL_COMMUNITY)
Admission: EM | Admit: 2022-07-02 | Discharge: 2022-07-03 | Disposition: A | Discharge status: Home / Self Care | Payer: Medicaid Other | Attending: Emergency Medicine | Admitting: Emergency Medicine

## 2022-07-02 ENCOUNTER — Emergency Department (HOSPITAL_COMMUNITY): Payer: Medicaid Other

## 2022-07-02 ENCOUNTER — Other Ambulatory Visit: Payer: Self-pay

## 2022-07-02 ENCOUNTER — Encounter (HOSPITAL_COMMUNITY): Payer: Self-pay

## 2022-07-02 DIAGNOSIS — Z041 Encounter for examination and observation following transport accident: Secondary | ICD-10-CM | POA: Diagnosis not present

## 2022-07-02 DIAGNOSIS — M549 Dorsalgia, unspecified: Secondary | ICD-10-CM

## 2022-07-02 DIAGNOSIS — Y92410 Unspecified street and highway as the place of occurrence of the external cause: Secondary | ICD-10-CM | POA: Insufficient documentation

## 2022-07-02 DIAGNOSIS — M545 Low back pain, unspecified: Secondary | ICD-10-CM | POA: Insufficient documentation

## 2022-07-02 DIAGNOSIS — M546 Pain in thoracic spine: Secondary | ICD-10-CM | POA: Insufficient documentation

## 2022-07-02 MED ORDER — IBUPROFEN 100 MG/5ML PO SUSP
600.0000 mg | Freq: Once | ORAL | Status: AC
  Administered 2022-07-03: 600 mg via ORAL
  Filled 2022-07-02: qty 30
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
## 2022-07-03 DIAGNOSIS — Z041 Encounter for examination and observation following transport accident: Secondary | ICD-10-CM | POA: Diagnosis not present

## 2022-07-03 MED ORDER — METHOCARBAMOL 500 MG PO TABS: 500.0000 mg | ORAL_TABLET | Freq: Two times a day (BID) | ORAL | 0 refills | Status: DC

## 2022-07-03 MED ORDER — IBUPROFEN 40 MG/ML PO SUSP: 600.0000 mg | Freq: Three times a day (TID) | ORAL | 0 refills | Status: DC | PRN

## 2022-07-03 NOTE — Discharge Instructions (Signed)
Take the prescribed medication as directed.  Can use heat/ice as well for added relief. Follow-up with your primary care doctor. Return to the ED for new or worsening symptoms.
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
## 2022-07-04 ENCOUNTER — Encounter: Payer: Medicaid Other | Admitting: Family Medicine
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
## 2022-07-07 DIAGNOSIS — S335XXA Sprain of ligaments of lumbar spine, initial encounter: Secondary | ICD-10-CM | POA: Diagnosis not present

## 2022-07-07 DIAGNOSIS — S139XXA Sprain of joints and ligaments of unspecified parts of neck, initial encounter: Secondary | ICD-10-CM | POA: Diagnosis not present

## 2022-07-07 DIAGNOSIS — S239XXA Sprain of unspecified parts of thorax, initial encounter: Secondary | ICD-10-CM | POA: Diagnosis not present

## 2022-07-09 DIAGNOSIS — Z309 Encounter for contraceptive management, unspecified: Secondary | ICD-10-CM | POA: Diagnosis not present

## 2022-07-13 ENCOUNTER — Ambulatory Visit (INDEPENDENT_AMBULATORY_CARE_PROVIDER_SITE_OTHER): Payer: Medicaid Other | Admitting: Family Medicine

## 2022-07-13 ENCOUNTER — Encounter: Payer: Self-pay | Admitting: Family Medicine

## 2022-07-13 VITALS — BP 114/86 | HR 82 | Ht 62.0 in | Wt 164.6 lb

## 2022-07-13 DIAGNOSIS — D649 Anemia, unspecified: Secondary | ICD-10-CM

## 2022-07-13 DIAGNOSIS — Z91018 Allergy to other foods: Secondary | ICD-10-CM | POA: Diagnosis not present

## 2022-07-13 DIAGNOSIS — E785 Hyperlipidemia, unspecified: Secondary | ICD-10-CM | POA: Diagnosis not present

## 2022-07-13 DIAGNOSIS — M549 Dorsalgia, unspecified: Secondary | ICD-10-CM

## 2022-07-13 DIAGNOSIS — Z0000 Encounter for general adult medical examination without abnormal findings: Secondary | ICD-10-CM | POA: Diagnosis not present

## 2022-07-13 DIAGNOSIS — E041 Nontoxic single thyroid nodule: Secondary | ICD-10-CM | POA: Diagnosis not present

## 2022-07-13 DIAGNOSIS — T7802XD Anaphylactic reaction due to shellfish (crustaceans), subsequent encounter: Secondary | ICD-10-CM

## 2022-07-13 MED ORDER — EPINEPHRINE 0.3 MG/0.3ML IJ SOAJ: INTRAMUSCULAR | 0 refills | Status: AC
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
## 2022-07-13 NOTE — Patient Instructions (Addendum)
It was great to see you! Your exam was normal today.  We are checking some labs (cholesterol, iron levels). I will send you a MyChart message with the results.   I can tell you are attentive to your overall health- keep up the great work! You are setting an excellent example for your children.  Use your nasal spray for your allergy symptoms!  Things to do to keep yourself healthy: - Exercise at least 30-45 minutes a day, 3-4 days a week.  - Eat a diet with lots of fruits and vegetables (5 servings per day). - Avoid sugar sweetened beverages and processed foods whenever possible. - Seatbelts can save your life. Wear them always.  - Alcohol -  If you drink, do it moderately, less than 1 drink per day. - Sleep - aim for 7-8 hours nightly. - Limit screen time to no more than 2 hours per day.  - Health Care Power of Attorney. Choose someone to make decisions for you if you are not able.  - Depression is common in our stressful world. If you're feeling down or losing interest in things you normally enjoy, please come in for a visit.  - Violence - If anyone is threatening or hurting you, please call immediately.   Take care, Dr Anner Crete

## 2022-07-14 ENCOUNTER — Encounter: Payer: Self-pay | Admitting: Family Medicine

## 2022-07-14 DIAGNOSIS — E785 Hyperlipidemia, unspecified: Secondary | ICD-10-CM | POA: Insufficient documentation

## 2022-07-14 LAB — LIPID PANEL
Chol/HDL Ratio: 4.8 ratio — ABNORMAL HIGH (ref 0.0–4.4)
Cholesterol, Total: 211 mg/dL — ABNORMAL HIGH (ref 100–199)
HDL: 44 mg/dL (ref >39)
LDL Chol Calc (NIH): 152 mg/dL — ABNORMAL HIGH (ref 0–99)
Triglycerides: 81 mg/dL (ref 0–149)
VLDL Cholesterol Cal: 15 mg/dL (ref 5–40)

## 2022-07-14 LAB — FERRITIN: Ferritin: 101 ng/mL (ref 15–150)

## 2022-07-14 NOTE — Assessment & Plan Note (Addendum)
Last lipids in 2019 showed elevated LDL (145) and total cholesterol (210). Did not meet criteria for lipid lowering medication at that time. Check repeat lipid panel today. Continue lifestyle modifications.

## 2022-07-14 NOTE — Assessment & Plan Note (Signed)
Ultrasound April 2022 showed stable thyroid nodules that did not meet criteria for dedicated follow-up.

## 2022-07-14 NOTE — Assessment & Plan Note (Signed)
-  Refill sent for EpiPen as current one is expired

## 2022-09-24 DIAGNOSIS — Z309 Encounter for contraceptive management, unspecified: Secondary | ICD-10-CM | POA: Diagnosis not present

## 2022-10-02 ENCOUNTER — Ambulatory Visit (INDEPENDENT_AMBULATORY_CARE_PROVIDER_SITE_OTHER): Payer: Medicaid Other

## 2022-10-02 DIAGNOSIS — Z23 Encounter for immunization: Secondary | ICD-10-CM

## 2022-10-13 IMAGING — US US THYROID
1 series · 13 of 25 positions shown · non-contrast
Comparison: 06/21/2019

CLINICAL DATA: Follow-up thyroid nodules.

EXAM:
THYROID ULTRASOUND
TECHNIQUE: Ultrasound examination of the thyroid gland and adjacent soft
tissues was performed.

[Series 1: us thyroid · 0.04mm/px · 55 acquisitions, 13 frames shown]
[im 1/55]
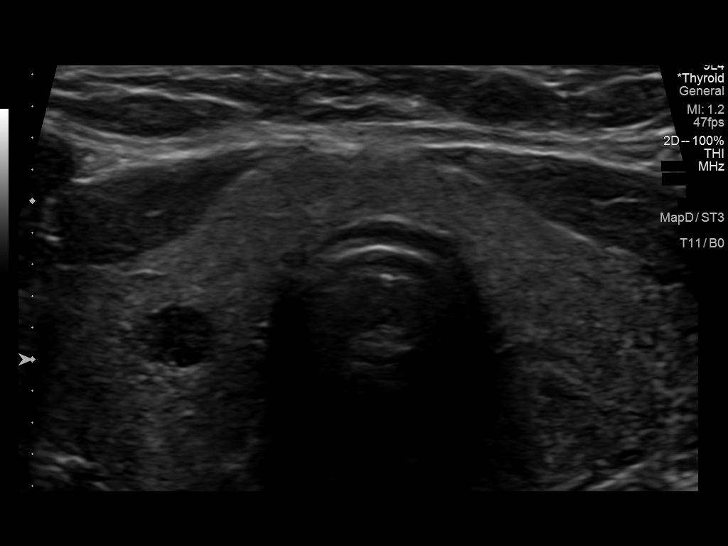
[im 5/55]
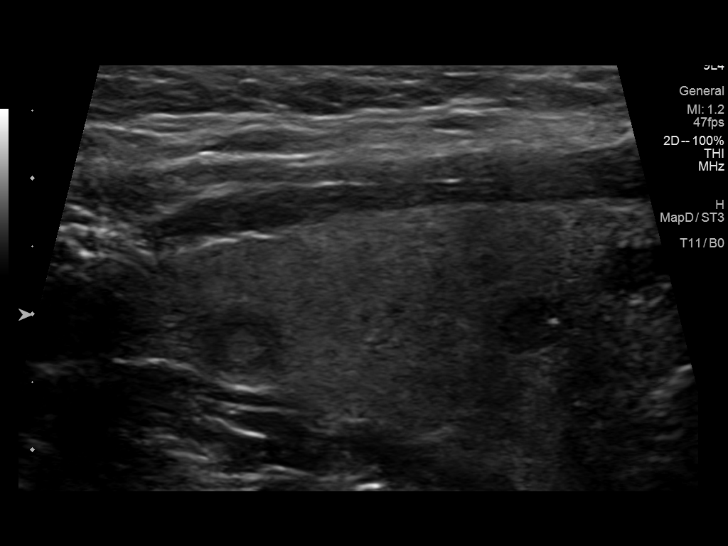
[im 10/55]
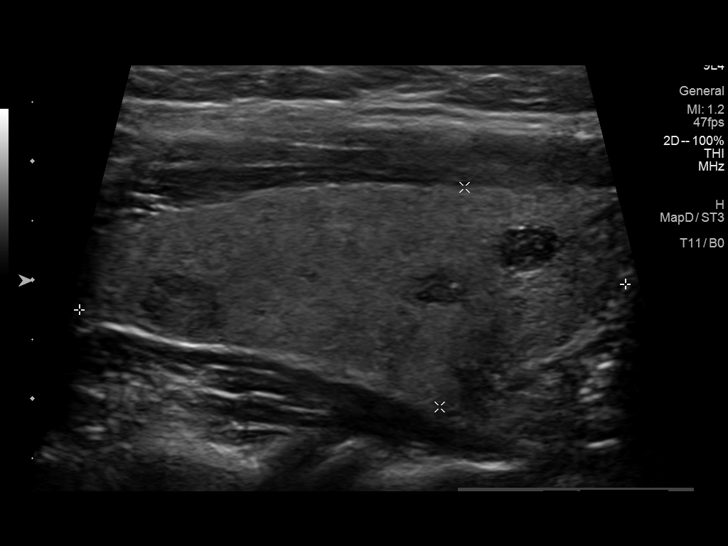
[im 14/55]
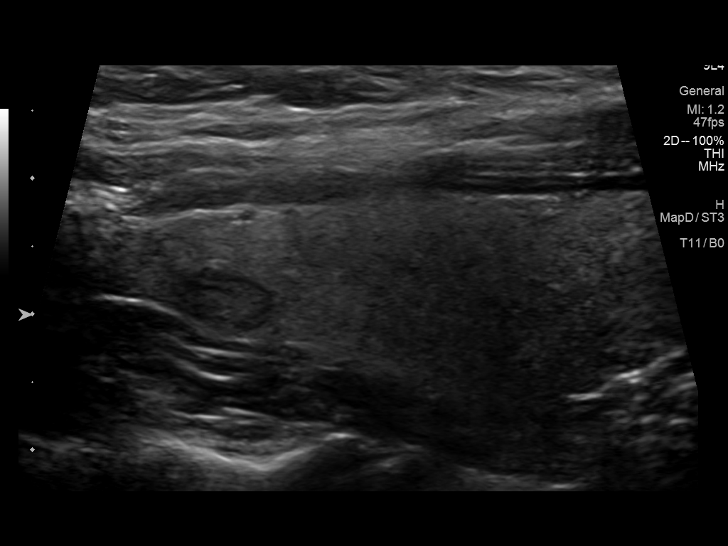
[im 19/55]
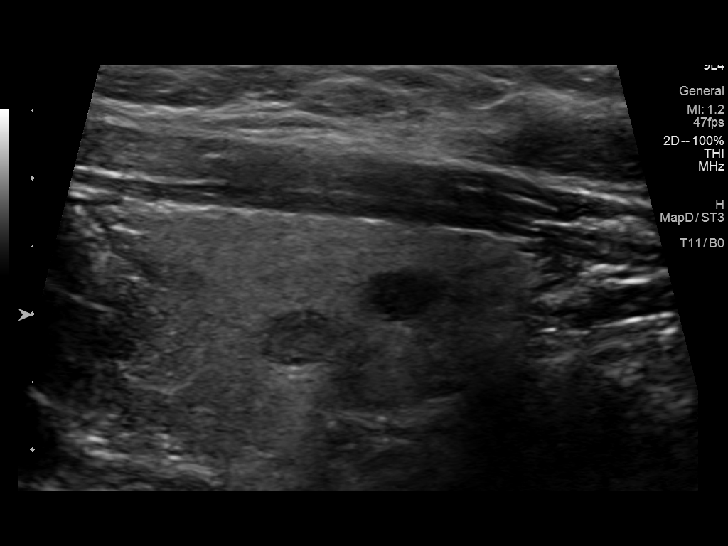
[im 23/55]
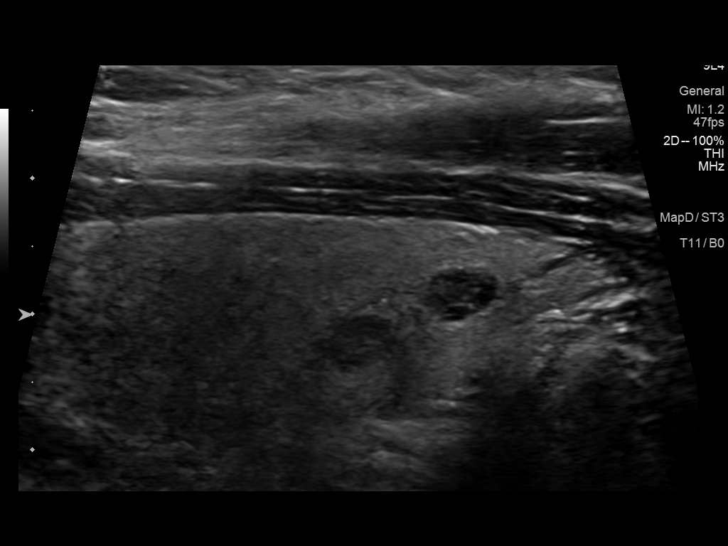
[im 28/55]
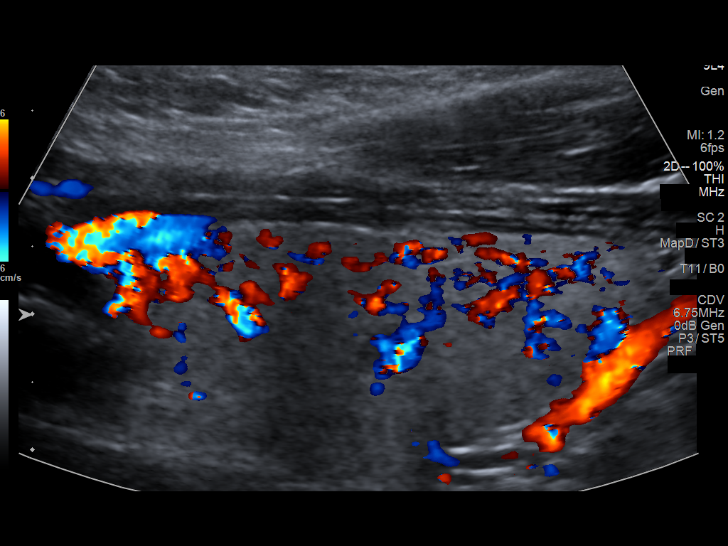
[im 32/55]
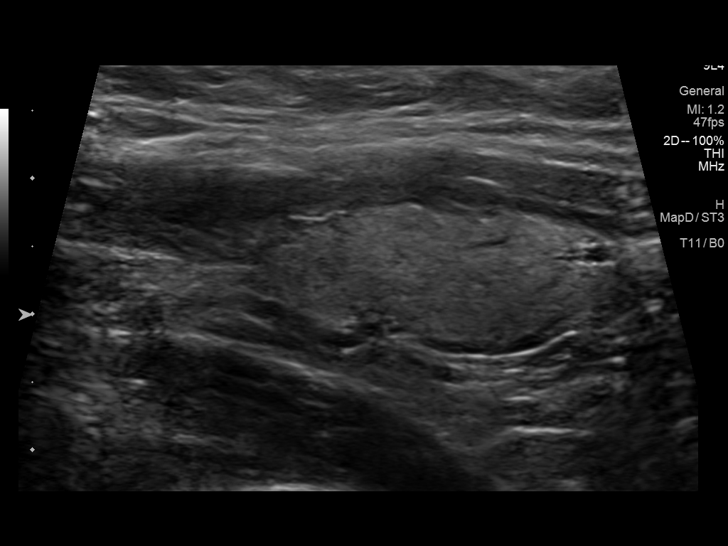
[im 37/55]
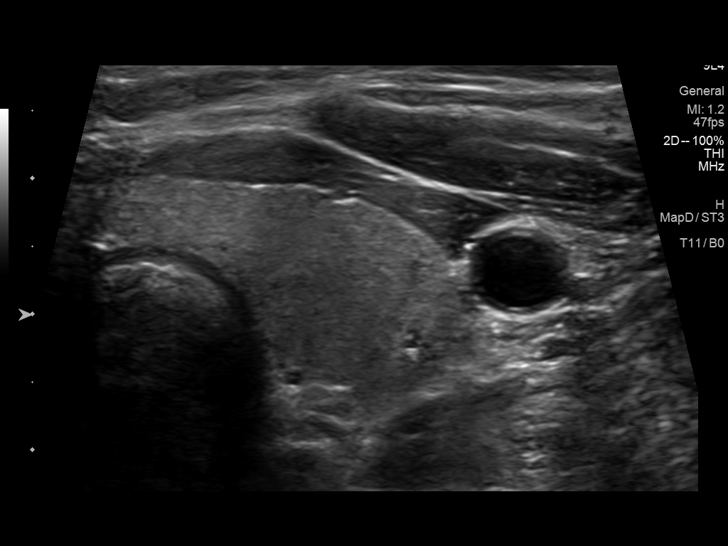
[im 41/55]
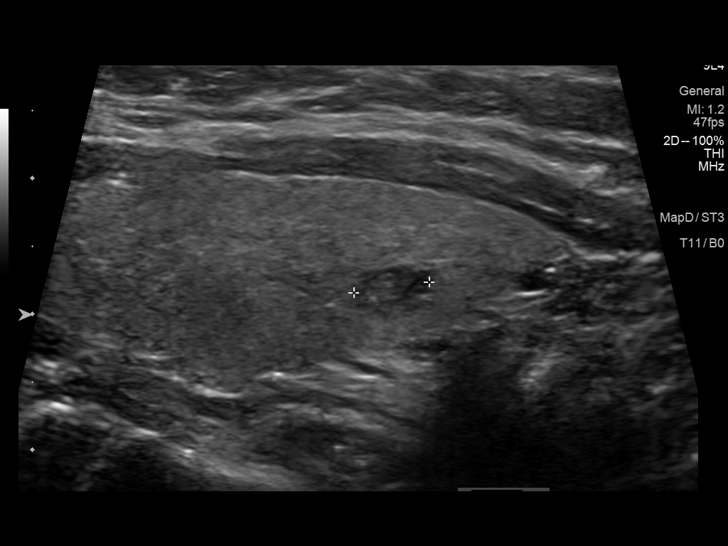
[im 46/55]
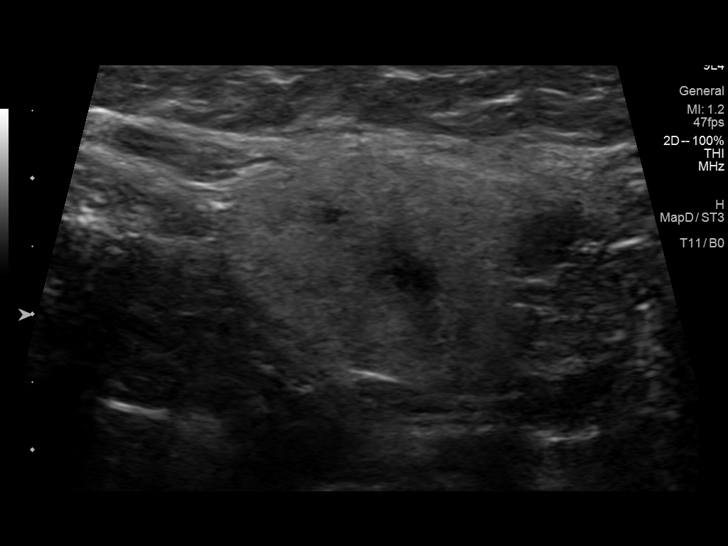
[im 50/55]
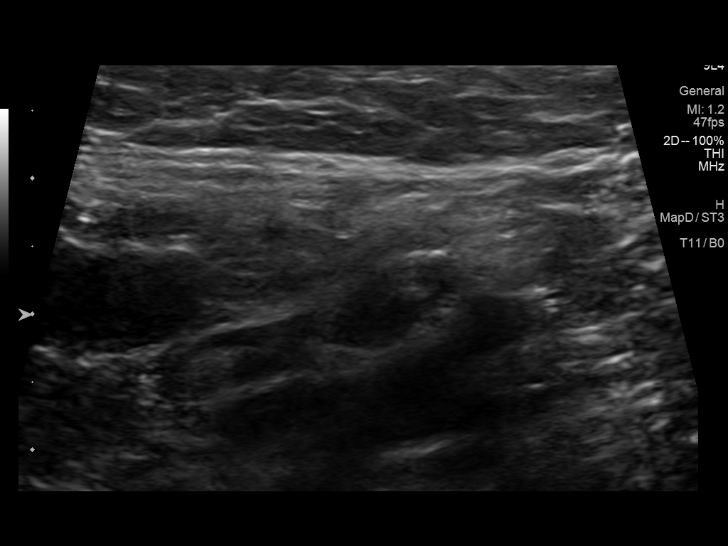
[im 55/55]
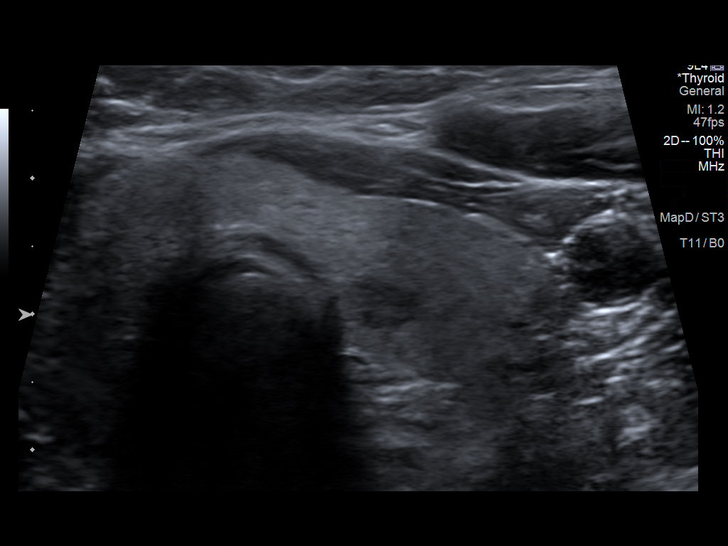

[13 of 25 positions shown; findings below may reference images not displayed]

FINDINGS: Parenchymal Echotexture: Mildly heterogenous

Isthmus: 0.4 cm, previously 0.3 cm

Right lobe: 4.6 x 1.9 x 1.8 cm, previously 4.9 x 2.2 x 1.9 cm

Left lobe: 4.6 x 1.5 x 1.6 cm, previously 4.7 x 1.6 x 1.7 cm

_________________________________________________________

Estimated total number of nodules >/= 1 cm: 0

Number of spongiform nodules >/=  2 cm not described below (TR1): 0

Number of mixed cystic and solid nodules >/= 1.5 cm not described
below (TR2): 0

_________________________________________________________

Nodule 1 in the right superior thyroid lobe is slightly hypoechoic
and measures to 0.6 x 0.5 x 0.7 cm and previously measured 0.6 x
x 0.5 cm. Given size (<0.9 cm) and appearance, this nodule does NOT
meet TI-RADS criteria for biopsy or dedicated follow-up.

Previously there was a poorly defined 1.2 cm hypoechoic nodule in
the right mid thyroid lobe. This nodule is no longer present.

Two hypoechoic nodules in the right inferior thyroid lobe have
minimally changed from the previous examination, largest measures
0.6 cm in diameter.

Nodule 4 is a poorly defined nodule in the left inferior thyroid
lobe measuring 0.6 x 0.4 x 0.6 cm and previously measured 0.6 x
x 0.6 cm. This nodule has minimally changed.
IMPRESSION: 1. Small thyroid nodules, largest measuring 0.7 cm. These nodules do
not meet criteria for biopsy or dedicated follow-up.
2. Previously there was a poorly defined hypoechoic nodule in the
right mid thyroid lobe and this finding is no longer present.
3. No new suspicious thyroid nodules.

The above is in keeping with the ACR TI-RADS recommendations - [HOSPITAL] 5247;[DATE].

## 2022-12-17 DIAGNOSIS — Z309 Encounter for contraceptive management, unspecified: Secondary | ICD-10-CM | POA: Diagnosis not present

## 2023-02-25 DIAGNOSIS — Z01419 Encounter for gynecological examination (general) (routine) without abnormal findings: Secondary | ICD-10-CM | POA: Diagnosis not present

## 2023-02-25 DIAGNOSIS — Z3040 Encounter for surveillance of contraceptives, unspecified: Secondary | ICD-10-CM | POA: Diagnosis not present

## 2023-02-25 DIAGNOSIS — Z6829 Body mass index (BMI) 29.0-29.9, adult: Secondary | ICD-10-CM | POA: Diagnosis not present

## 2023-02-25 DIAGNOSIS — Z1231 Encounter for screening mammogram for malignant neoplasm of breast: Secondary | ICD-10-CM | POA: Diagnosis not present

## 2023-03-05 DIAGNOSIS — Z309 Encounter for contraceptive management, unspecified: Secondary | ICD-10-CM | POA: Diagnosis not present

## 2023-05-20 DIAGNOSIS — Z309 Encounter for contraceptive management, unspecified: Secondary | ICD-10-CM | POA: Diagnosis not present

## 2023-07-15 ENCOUNTER — Encounter: Payer: Self-pay | Admitting: Family Medicine

## 2023-07-15 ENCOUNTER — Ambulatory Visit: Payer: Medicaid Other | Admitting: Family Medicine

## 2023-07-15 VITALS — BP 120/85 | HR 70 | Ht 63.0 in | Wt 167.4 lb

## 2023-07-15 DIAGNOSIS — E785 Hyperlipidemia, unspecified: Secondary | ICD-10-CM

## 2023-07-15 DIAGNOSIS — E611 Iron deficiency: Secondary | ICD-10-CM

## 2023-07-15 DIAGNOSIS — Z0000 Encounter for general adult medical examination without abnormal findings: Secondary | ICD-10-CM

## 2023-07-15 NOTE — Addendum Note (Signed)
Addended by: Janit Pagan T on: 07/15/2023 10:39 AM   Modules accepted: Level of Service

## 2023-07-15 NOTE — Assessment & Plan Note (Signed)
LDL 152 in 2023. Did not meet criteria for lipid lowering medication at that time. - Lipid panel - Increase exercise

## 2023-07-15 NOTE — Patient Instructions (Signed)
It was great to see you today! Thank you for choosing [facility] for your primary care.  It is great to see that you are doing so well!  Please try to increase the intensity/speed of your walks as we discussed, either with your dog or separately.  We are checking some labs today, including metabolic labs, lipid panel, iron labs.  You should return to our clinic in 1 year for another physical.  Thank you for coming to see [place] at [facility] and for the opportunity to care for you! Boykin Baetz, MD 07/15/2023, 9:22 AM  ____________________________________________________  Make sure to check out at the front desk before you leave today.  Please arrive at least 15 minutes prior to your scheduled appointments.  If you had blood work today, you will get a MyChart message or a letter if results are normal. Otherwise, you will get a call from [place].  If you need additional refills before your next appointment, please call your pharmacy first.
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
## 2023-07-16 ENCOUNTER — Telehealth: Payer: Self-pay | Admitting: Family Medicine

## 2023-07-16 LAB — BASIC METABOLIC PANEL
BUN/Creatinine Ratio: 11 (ref 9–23)
BUN: 9 mg/dL (ref 6–24)
CO2: 24 mmol/L (ref 20–29)
Calcium: 9.2 mg/dL (ref 8.7–10.2)
Chloride: 101 mmol/L (ref 96–106)
Creatinine, Ser: 0.82 mg/dL (ref 0.57–1.00)
Glucose: 84 mg/dL (ref 70–99)
Potassium: 4.3 mmol/L (ref 3.5–5.2)
Sodium: 136 mmol/L (ref 134–144)
eGFR: 92 mL/min/{1.73_m2} (ref >59)

## 2023-07-16 LAB — LIPID PANEL
Chol/HDL Ratio: 4.5 ratio — ABNORMAL HIGH (ref 0.0–4.4)
Cholesterol, Total: 203 mg/dL — ABNORMAL HIGH (ref 100–199)
HDL: 45 mg/dL (ref >39)
LDL Chol Calc (NIH): 148 mg/dL — ABNORMAL HIGH (ref 0–99)
Triglycerides: 55 mg/dL (ref 0–149)
VLDL Cholesterol Cal: 10 mg/dL (ref 5–40)

## 2023-07-16 LAB — FERRITIN: Ferritin: 102 ng/mL (ref 15–150)

## 2023-07-16 NOTE — Telephone Encounter (Signed)
Spoke with patient concerning elevated LDL.  Will not be starting statin, as patient's 10 year ASCVD risk score is less than 5%.  Discussed optimizing diet and increasing exercise.

## 2023-08-08 DIAGNOSIS — Z3042 Encounter for surveillance of injectable contraceptive: Secondary | ICD-10-CM | POA: Diagnosis not present

## 2023-11-07 DIAGNOSIS — Z3042 Encounter for surveillance of injectable contraceptive: Secondary | ICD-10-CM | POA: Diagnosis not present

## 2024-01-24 ENCOUNTER — Other Ambulatory Visit: Payer: Self-pay | Admitting: Obstetrics and Gynecology

## 2024-01-24 DIAGNOSIS — Z1231 Encounter for screening mammogram for malignant neoplasm of breast: Secondary | ICD-10-CM

## 2024-01-30 DIAGNOSIS — Z3042 Encounter for surveillance of injectable contraceptive: Secondary | ICD-10-CM | POA: Diagnosis not present

## 2024-02-27 ENCOUNTER — Ambulatory Visit
Admission: RE | Admit: 2024-02-27 | Discharge: 2024-02-27 | Disposition: A | Discharge status: Home / Self Care | Payer: Medicaid Other | Source: Ambulatory Visit | Attending: Obstetrics and Gynecology | Admitting: Obstetrics and Gynecology

## 2024-02-27 DIAGNOSIS — Z1231 Encounter for screening mammogram for malignant neoplasm of breast: Secondary | ICD-10-CM | POA: Diagnosis not present

## 2024-04-23 DIAGNOSIS — Z3040 Encounter for surveillance of contraceptives, unspecified: Secondary | ICD-10-CM | POA: Diagnosis not present

## 2024-04-23 DIAGNOSIS — Z1231 Encounter for screening mammogram for malignant neoplasm of breast: Secondary | ICD-10-CM | POA: Diagnosis not present

## 2024-04-23 DIAGNOSIS — Z01419 Encounter for gynecological examination (general) (routine) without abnormal findings: Secondary | ICD-10-CM | POA: Diagnosis not present

## 2024-04-23 DIAGNOSIS — Z1339 Encounter for screening examination for other mental health and behavioral disorders: Secondary | ICD-10-CM | POA: Diagnosis not present

## 2024-04-23 DIAGNOSIS — Z6830 Body mass index (BMI) 30.0-30.9, adult: Secondary | ICD-10-CM | POA: Diagnosis not present

## 2024-07-09 DIAGNOSIS — Z3042 Encounter for surveillance of injectable contraceptive: Secondary | ICD-10-CM | POA: Diagnosis not present

## 2024-10-01 DIAGNOSIS — Z3042 Encounter for surveillance of injectable contraceptive: Secondary | ICD-10-CM | POA: Diagnosis not present

## 2024-10-07 ENCOUNTER — Encounter: Admitting: Family Medicine

## 2024-10-15 ENCOUNTER — Ambulatory Visit

## 2024-10-15 VITALS — BP 120/76 | HR 80 | Ht 62.0 in | Wt 160.0 lb

## 2024-10-15 DIAGNOSIS — Z0000 Encounter for general adult medical examination without abnormal findings: Secondary | ICD-10-CM | POA: Diagnosis not present

## 2024-10-15 DIAGNOSIS — Z23 Encounter for immunization: Secondary | ICD-10-CM

## 2024-10-15 LAB — POCT GLYCOSYLATED HEMOGLOBIN (HGB A1C): Hemoglobin A1C: 5.3 % (ref 4.0–5.6)
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
## 2024-10-15 NOTE — Patient Instructions (Signed)
   It was great to see you!  Our plans for today:  - sheet / night gown cotton - Fans - can try Black cohosh 20-40 mg BID We are checking some labs today, we will release these results to your MyChart.  Take care and seek immediate care sooner if you develop any concerns.      Houston Coralee HAS PGY 1 Family Medicine Resident [facility]  [address] [place], [place] [address] Fax [phone] Phone [phone] 10/15/2024, 1:41 PM

## 2024-10-16 LAB — CBC WITH DIFFERENTIAL/PLATELET
Basophils Absolute: 0 x10E3/uL (ref 0.0–0.2)
Basos: 0 %
EOS (ABSOLUTE): 0 x10E3/uL (ref 0.0–0.4)
Eos: 0 %
Hematocrit: 38.6 % (ref 34.0–46.6)
Hemoglobin: 12.7 g/dL (ref 11.1–15.9)
Immature Grans (Abs): 0 x10E3/uL (ref 0.0–0.1)
Immature Granulocytes: 0 %
Lymphocytes Absolute: 2.6 x10E3/uL (ref 0.7–3.1)
Lymphs: 40 %
MCH: 31.1 pg (ref 26.6–33.0)
MCHC: 32.9 g/dL (ref 31.5–35.7)
MCV: 94 fL (ref 79–97)
Monocytes Absolute: 0.4 x10E3/uL (ref 0.1–0.9)
Monocytes: 7 %
Neutrophils Absolute: 3.4 x10E3/uL (ref 1.4–7.0)
Neutrophils: 53 %
Platelets: 217 x10E3/uL (ref 150–450)
RBC: 4.09 x10E6/uL (ref 3.77–5.28)
RDW: 12.3 % (ref 11.7–15.4)
WBC: 6.5 x10E3/uL (ref 3.4–10.8)

## 2024-10-16 LAB — CMP14+EGFR
ALT: 13 IU/L (ref 0–32)
AST: 15 IU/L (ref 0–40)
Albumin: 4.2 g/dL (ref 3.9–4.9)
Alkaline Phosphatase: 55 IU/L (ref 41–116)
BUN/Creatinine Ratio: 14 (ref 9–23)
BUN: 10 mg/dL (ref 6–24)
Bilirubin Total: 0.2 mg/dL (ref 0.0–1.2)
CO2: 23 mmol/L (ref 20–29)
Calcium: 9.2 mg/dL (ref 8.7–10.2)
Chloride: 102 mmol/L (ref 96–106)
Creatinine, Ser: 0.72 mg/dL (ref 0.57–1.00)
Globulin, Total: 2.4 g/dL (ref 1.5–4.5)
Glucose: 92 mg/dL (ref 70–99)
Potassium: 3.8 mmol/L (ref 3.5–5.2)
Sodium: 140 mmol/L (ref 134–144)
Total Protein: 6.6 g/dL (ref 6.0–8.5)
eGFR: 107 mL/min/1.73 (ref >59)

## 2024-10-16 LAB — LIPID PANEL
Chol/HDL Ratio: 5 ratio — ABNORMAL HIGH (ref 0.0–4.4)
Cholesterol, Total: 208 mg/dL — ABNORMAL HIGH (ref 100–199)
HDL: 42 mg/dL (ref >39)
LDL Chol Calc (NIH): 141 mg/dL — ABNORMAL HIGH (ref 0–99)
Triglycerides: 140 mg/dL (ref 0–149)
VLDL Cholesterol Cal: 25 mg/dL (ref 5–40)

## 2024-10-16 LAB — TSH RFX ON ABNORMAL TO FREE T4: TSH: 0.772 u[IU]/mL (ref 0.450–4.500)

## 2024-10-20 ENCOUNTER — Ambulatory Visit: Payer: Self-pay

## 2024-10-20 NOTE — Addendum Note (Signed)
 Addended by: DELORES, Heloise Gordan on: 10/20/2024 01:52 PM   Modules accepted: Level of Service

## 2024-12-18 DIAGNOSIS — Z3042 Encounter for surveillance of injectable contraceptive: Secondary | ICD-10-CM | POA: Diagnosis not present
